# Patient Record
Sex: Male | Born: 1940 | Race: White | Hispanic: No | Marital: Single | Smoking: Former smoker
Health system: Southern US, Community
[De-identification: ages and names within clinical notes are randomized; demographics above are authoritative.]

## PROBLEM LIST (undated history)

## (undated) DIAGNOSIS — R0600 Dyspnea, unspecified: Secondary | ICD-10-CM

## (undated) DIAGNOSIS — I340 Nonrheumatic mitral (valve) insufficiency: Secondary | ICD-10-CM

## (undated) DIAGNOSIS — I2510 Atherosclerotic heart disease of native coronary artery without angina pectoris: Principal | ICD-10-CM

## (undated) DIAGNOSIS — J449 Chronic obstructive pulmonary disease, unspecified: Secondary | ICD-10-CM

## (undated) DIAGNOSIS — E119 Type 2 diabetes mellitus without complications: Secondary | ICD-10-CM

## (undated) DIAGNOSIS — E785 Hyperlipidemia, unspecified: Secondary | ICD-10-CM

## (undated) DIAGNOSIS — K922 Gastrointestinal hemorrhage, unspecified: Secondary | ICD-10-CM

## (undated) DIAGNOSIS — I482 Chronic atrial fibrillation, unspecified: Secondary | ICD-10-CM

## (undated) DIAGNOSIS — D649 Anemia, unspecified: Secondary | ICD-10-CM

## (undated) DIAGNOSIS — Z9861 Coronary angioplasty status: Principal | ICD-10-CM

## (undated) DIAGNOSIS — S2239XA Fracture of one rib, unspecified side, initial encounter for closed fracture: Secondary | ICD-10-CM

## (undated) DIAGNOSIS — R0609 Other forms of dyspnea: Secondary | ICD-10-CM

## (undated) DIAGNOSIS — Z951 Presence of aortocoronary bypass graft: Secondary | ICD-10-CM

## (undated) DIAGNOSIS — I4891 Unspecified atrial fibrillation: Secondary | ICD-10-CM

## (undated) DIAGNOSIS — R791 Abnormal coagulation profile: Secondary | ICD-10-CM

## (undated) DIAGNOSIS — I10 Essential (primary) hypertension: Secondary | ICD-10-CM

## (undated) HISTORY — DX: Atherosclerotic heart disease of native coronary artery without angina pectoris: I25.10

## (undated) HISTORY — DX: Other forms of dyspnea: R06.09

## (undated) HISTORY — DX: Hyperlipidemia, unspecified: E78.5

## (undated) HISTORY — DX: Presence of aortocoronary bypass graft: Z95.1

## (undated) HISTORY — DX: Essential (primary) hypertension: I10

## (undated) HISTORY — DX: Dyspnea, unspecified: R06.00

## (undated) HISTORY — DX: Chronic obstructive pulmonary disease, unspecified: J44.9

## (undated) HISTORY — DX: Coronary angioplasty status: Z98.61

## (undated) HISTORY — DX: Type 2 diabetes mellitus without complications: E11.9

## (undated) HISTORY — DX: Nonrheumatic mitral (valve) insufficiency: I34.0

## (undated) HISTORY — DX: Chronic atrial fibrillation, unspecified: I48.20

---

## 2005-09-23 DIAGNOSIS — I2510 Atherosclerotic heart disease of native coronary artery without angina pectoris: Secondary | ICD-10-CM

## 2005-09-23 HISTORY — DX: Atherosclerotic heart disease of native coronary artery without angina pectoris: I25.10

## 2005-10-02 ENCOUNTER — Ambulatory Visit (HOSPITAL_COMMUNITY): Admission: RE | Admit: 2005-10-02 | Discharge: 2005-10-03 | Payer: Self-pay | Admitting: *Deleted

## 2005-12-24 HISTORY — PX: CARDIAC CATHETERIZATION: SHX172

## 2006-10-24 DIAGNOSIS — I2510 Atherosclerotic heart disease of native coronary artery without angina pectoris: Secondary | ICD-10-CM

## 2006-10-24 HISTORY — DX: Atherosclerotic heart disease of native coronary artery without angina pectoris: I25.10

## 2006-11-13 ENCOUNTER — Encounter: Admission: RE | Admit: 2006-11-13 | Discharge: 2006-11-13 | Payer: Self-pay | Admitting: *Deleted

## 2006-11-19 ENCOUNTER — Ambulatory Visit (HOSPITAL_COMMUNITY): Admission: RE | Admit: 2006-11-19 | Discharge: 2006-11-19 | Payer: Self-pay | Admitting: *Deleted

## 2006-12-24 HISTORY — PX: CORONARY ARTERY BYPASS GRAFT: SHX141

## 2007-01-24 DIAGNOSIS — Z951 Presence of aortocoronary bypass graft: Secondary | ICD-10-CM

## 2007-01-24 HISTORY — DX: Presence of aortocoronary bypass graft: Z95.1

## 2007-02-05 ENCOUNTER — Inpatient Hospital Stay (HOSPITAL_COMMUNITY): Admission: RE | Admit: 2007-02-05 | Discharge: 2007-02-10 | Payer: Self-pay | Admitting: Surgery

## 2007-02-05 ENCOUNTER — Ambulatory Visit: Payer: Self-pay | Admitting: Surgery

## 2007-02-14 ENCOUNTER — Ambulatory Visit: Payer: Self-pay | Admitting: Cardiothoracic Surgery

## 2007-02-21 ENCOUNTER — Ambulatory Visit: Payer: Self-pay | Admitting: Cardiothoracic Surgery

## 2007-02-28 ENCOUNTER — Ambulatory Visit: Payer: Self-pay | Admitting: Cardiothoracic Surgery

## 2007-02-28 ENCOUNTER — Encounter: Admission: RE | Admit: 2007-02-28 | Discharge: 2007-02-28 | Payer: Self-pay | Admitting: Surgery

## 2007-03-06 ENCOUNTER — Encounter (HOSPITAL_COMMUNITY): Admission: RE | Admit: 2007-03-06 | Discharge: 2007-04-04 | Payer: Self-pay | Admitting: *Deleted

## 2007-06-06 ENCOUNTER — Ambulatory Visit: Payer: Self-pay | Admitting: Cardiothoracic Surgery

## 2007-06-12 ENCOUNTER — Encounter: Admission: RE | Admit: 2007-06-12 | Discharge: 2007-06-12 | Payer: Self-pay | Admitting: *Deleted

## 2007-06-16 ENCOUNTER — Ambulatory Visit (HOSPITAL_COMMUNITY): Admission: RE | Admit: 2007-06-16 | Discharge: 2007-06-16 | Payer: Self-pay | Admitting: *Deleted

## 2007-09-05 ENCOUNTER — Ambulatory Visit (HOSPITAL_COMMUNITY): Admission: RE | Admit: 2007-09-05 | Discharge: 2007-09-05 | Payer: Self-pay | Admitting: Cardiovascular Disease

## 2008-10-01 DIAGNOSIS — I10 Essential (primary) hypertension: Secondary | ICD-10-CM

## 2008-10-01 DIAGNOSIS — E785 Hyperlipidemia, unspecified: Secondary | ICD-10-CM

## 2008-10-04 ENCOUNTER — Ambulatory Visit: Payer: Self-pay | Admitting: Internal Medicine

## 2008-10-04 DIAGNOSIS — R0609 Other forms of dyspnea: Secondary | ICD-10-CM

## 2008-10-25 ENCOUNTER — Ambulatory Visit: Payer: Self-pay | Admitting: Internal Medicine

## 2008-10-26 ENCOUNTER — Encounter: Payer: Self-pay | Admitting: Internal Medicine

## 2008-10-26 ENCOUNTER — Telehealth: Payer: Self-pay | Admitting: Internal Medicine

## 2008-10-26 ENCOUNTER — Ambulatory Visit: Payer: Self-pay | Admitting: Internal Medicine

## 2008-10-26 ENCOUNTER — Encounter (INDEPENDENT_AMBULATORY_CARE_PROVIDER_SITE_OTHER): Payer: Self-pay | Admitting: *Deleted

## 2008-10-29 ENCOUNTER — Telehealth (INDEPENDENT_AMBULATORY_CARE_PROVIDER_SITE_OTHER): Payer: Self-pay | Admitting: *Deleted

## 2009-05-25 ENCOUNTER — Encounter: Admission: RE | Admit: 2009-05-25 | Discharge: 2009-05-25 | Payer: Self-pay | Admitting: Emergency Medicine

## 2011-01-14 ENCOUNTER — Encounter: Payer: Self-pay | Admitting: Surgery

## 2011-05-11 NOTE — Op Note (Signed)
NAMECAPTAIN, BLUCHER                ACCOUNT NO.:  [id number]   MEDICAL RECORD NO.:  [id number]          PATIENT TYPE:  INP   LOCATION:  2305                         FACILITY:  [facility]   PHYSICIAN:  Evelene Croon, M.D.     DATE OF BIRTH:  1941/02/20   DATE OF PROCEDURE:  02/05/2007  DATE OF DISCHARGE:                               OPERATIVE REPORT   PREOPERATIVE DIAGNOSIS:  Severe three-vessel coronary disease.   POSTOPERATIVE DIAGNOSIS:  Severe three-vessel coronary disease.   OPERATIVE PROCEDURE:  Median sternotomy, extracorporeal circulation,  coronary bypass graft surgery x4 using a left internal mammary artery  graft to the left anterior descending coronary artery, with a saphenous  vein graft to the diagonal branch of the left anterior descending  artery, a saphenous vein graft to the intermediate coronary artery, and  a saphenous vein graft to the posterolateral branch of the left  circumflex coronary artery.  Endoscopic vein harvesting from both legs.   ATTENDING SURGEON:  Evelene Croon, M.D.   ASSISTANT:  Salvatore Decent. Cornelius Moras, M.D.   SECOND ASSISTANT:  Constance Holster, [place]   ANESTHESIA:  General endotracheal.   CLINICAL HISTORY:  This patient is a 70 year old gentleman with a  history of hypertension, hyperlipidemia, and ongoing heavy tobacco  abuse, who presented with an abnormal EKG and chest pain and underwent  cardiac catheterization on October 02, 2006, showing an 80% mid-LAD  stenosis involving a diagonal bifurcation as well as a 99% mid AV groove  left circumflex lesion.  He underwent stenting of the left circumflex  lesion at that time with a Cypher stent.  His chest pain resolved and he  had been doing well.  He subsequently was scheduled for a staged PCI of  his LAD lesion and underwent catheterization on November 19, 2006, which  showed significant three-vessel disease.  A decision was made not to  perform stenting of the LAD at that time and he was referred for  surgery.  The LAD was a large vessel that coursed around the apex.  There was about 40% ostial and proximal stenosis with calcification and  the mid LAD was tortuous at the takeoff of the first diagonal with about  60% stenosis.  There is a long 80% stenosis beyond the diagonal takeoff.  The diagonal itself had about 60% proximal stenosis.  The left  circumflex gave off a medium-sized intermediate branch that had proximal  stenosis.  The left circumflex was a large vessel that gave off a  posterior descending and posterolateral branch.  There was 70% stenosis  prior to the origin of the AV groove stent, which was widely patent.  There is also about 80% ostial stenosis in the posterior descending  branch.  The right coronary artery was a small, nondominant vessel which  had sequential 60-80% proximal stenosis.  After review of the angiogram  and examination of the patient, it was felt that coronary bypass graft  surgery was the best treatment for this patient.  I discussed the  operative procedure with the patient including alternatives, benefits,  and risks including but not limited  to bleeding, blood transfusion,  infection, stroke, myocardial infarction, graft failure, and death.  I  also discussed the importance of maximum cardiac risk factor reduction.  He understood and agreed to proceed.   OPERATIVE PROCEDURE:  The patient was taken to the operating room and  placed on the table in supine position.  After induction of general  endotracheal anesthesia, a Foley catheter was placed in the bladder  using sterile technique.  Then the chest, abdomen and both lower  extremities were prepped and draped in the usual sterile manner.  The  chest was opened through a median sternotomy incision and the  pericardium opened in the midline.  Examination of the heart showed good  ventricular contractility.  The ascending aorta had no palpable plaques  in it.   Then the left internal mammary artery  was harvested from the chest wall  as a pedicle graft.  This is a medium caliber vessel with excellent  blood flow through it.  At the same time a segment of greater saphenous  vein was harvested from the right leg using endoscopic vein harvest  technique.  This vein was of small to medium caliber but good quality.  Below the knee this vein was too small and therefore another section of  vein was harvested from the left thigh using endoscopic vein harvest  technique, and this vein was also of small to medium caliber and good  quality.   Then the patient was heparinized and when an adequate activated clotting  time was achieved, the distal ascending aorta was cannulated using a 20-  [place] aortic cannula for arterial inflow.  Venous outflow was achieved  using a two-stage venous cannula through the right atrial appendage.  An  antegrade cardioplegia and vent cannula was inserted in the aortic root.   The patient was placed on cardiopulmonary bypass and the distal coronary  arteries identified.  The LAD was a large, graftable vessel with no  distal disease in it.  The diagonal branch was a large vessel with no  distal disease in it.  The intermediate was a large vessel that became  intramyocardial distally.  It was located just as it was entering the  muscle, where was a large, graftable vessel.  The left circumflex had a  large posterolateral branch, which was suitable for grafting.  The  distal left circumflex terminated as a small posterior descending branch  that was lying high near the AV groove and was very small where it was  visible and not suitable for grafting.  The right coronary was a small,  nondominant vessel that was lying deep beneath the epicardial fat and  was not suitable for grafting.   Then the aorta was crossclamped and 1000 mL of cold blood antegrade  cardioplegia was administered in the aortic root with quick arrest of the heart.  Systemic hypothermia to 28  degrees centigrade and topical  hypothermic with iced saline was used.  A temperature probe was placed  in the septum and an insulating pad in the pericardium.   The first distal anastomosis was performed to the posterolateral branch  of the left circumflex coronary.  The internal diameter was about 2 mm.  Conduit used was a segment of greater saphenous vein and the anastomosis  performed in an end-to-side manner using continuous 7-0 Prolene suture.  Flow was noted through the graft and was excellent.   The second distal anastomosis was performed to the intermediate coronary  artery.  The internal  diameter of this vessel was about 1.75 mm.  The  conduit used was a second segment of greater saphenous vein and the  anastomosis performed in an end-to-side manner using continuous 7-0  Prolene suture.  Flow was noted through the graft and was excellent.   A third distal anastomosis was performed to the diagonal branch.  The  internal diameter was about 1.75 mm.  The conduit used was a third  segment of greater saphenous vein and the anastomosis performed in an  end-to-side manner using continuous 7-0 Prolene suture.  Flow was noted  through the graft and was excellent.  Then another dose of cardioplegia  given down the vein graft and in the aortic root.   The fourth distal anastomosis was performed to the distal LAD.  The  internal diameter of this vessel was about 2 mm.  The conduit used was  the  left internal mammary graft and this was brought through an opening  in the left pericardium anterior to the phrenic nerve.  It was  anastomosed the LAD in an end-to-side manner continuous 8-0 Prolene  suture.  The pedicle was sutured to the epicardium with 6-0 Prolene  sutures.  The patient was rewarmed to 37 degrees centigrade.  Then  another dose of cardioplegia was given in the aortic root.  The proximal  vein graft anastomoses were then performed to the aortic root in an end-  to-side  manner continuous 6-0 Prolene suture.  Then the clamp was  removed from the mammary pedicle.  There was rapid warming of the  ventricular septum and return of spontaneous ventricular fibrillation.  The crossclamp was then removed with a time of 74 minutes and the  patient spontaneously converted to sinus rhythm.   The proximal and distal anastomoses appeared hemostatic and the  alignment of the grafts satisfactory.  Graft markers were placed around  the proximal anastomoses.  Two temporary right ventricular and right  atrial pacing wires were placed and brought out through the skin.   When the patient had rewarmed to 37 degrees centigrade, he was weaned  from cardiopulmonary bypass on no inotropic agents.  Total bypass time  was 91 minutes.  Cardiac function appeared excellent with a cardiac  output of 5 L/min.  Protamine was given and venous and aortic cannulas  were removed without difficulty.  Hemostasis was achieved without difficulty.  Three chest tubes were placed with a tube in the posterior  pericardium, one in the left pleural space, and one in the anterior  mediastinum.  The pericardium was reapproximated over the heart.  The  sternum was closed with #6 stainless steel wires.  The fascia was closed  with continuous #1 Vicryl suture.  The subcutaneous tissue was closed  with continuous 2-0 Vicryl and the skin with a 3-0 Vicryl subcuticular  closure.  The lower extremity vein harvest site was closed in layers in  a similar manner.  The sponge, needle and instrument counts were correct  according to the scrub nurse.  Dry sterile dressings were applied over  the incisions and around the chest tubes, which were hooked to Pleur-  Evac suction.  The patient remained hemodynamically stable and was  transported to the SICU in guarded but stable condition.      Evelene Croon, M.D.  Electronically Signed     BB/MEDQ  D:  02/05/2007  T:  02/05/2007  Job:  [id number]   cc:   Darlin Priestly, MD  [facility]

## 2011-05-11 NOTE — Discharge Summary (Signed)
Eric Calderon, Eric Calderon                ACCOUNT NO.:  [id number]   MEDICAL RECORD NO.:  [id number]          PATIENT TYPE:  INP   LOCATION:  2030                         FACILITY:  [facility]   PHYSICIAN:  Evelene Croon, M.D.     DATE OF BIRTH:  03-15-1941   DATE OF ADMISSION:  02/05/2007  DATE OF DISCHARGE:  02/11/2004                               DISCHARGE SUMMARY   PRIMARY DIAGNOSIS:  Severe three-vessel coronary artery disease.   IN-HOSPITAL DIAGNOSES:  1. Postoperative atrial fibrillation.  2. Acute blood loss anemia.  3. Diabetes mellitus type 2.  4. Volume overload postoperatively.   SECONDARY DIAGNOSES:  1. Hypertension.  2. Hyperlipidemia.  3. Tobacco abuse.  4. Coronary artery disease, status post stenting of the left      circumflex lesion with a Cypher stent.  5. History of gout.   OPERATIONS AND PROCEDURES:  1. Coronary bypass grafting x4 using a left internal mammary artery      graft to the left anterior descending coronary artery, saphenous      vein graft to diagonal branch of the left anterior descending      artery, saphenous vein graft to intermediate coronary artery,      saphenous vein graft to posterolateral branch of left circumflex      coronary artery.  Endoscopic vein harvesting from bilateral legs.   PATIENT'S HISTORY AND PHYSICAL AND HOSPITAL COURSE:  The patient is a 59-  year gentleman with history of hypertension, hyperlipidemia, ongoing  tobacco abuse who presented with abnormal EKG and chest pain.  Cardiac  catheterization was done October 02, 2006 showing an 80% mid LAD  stenosis involving the diagonal bifurcation as well as 99% mid AV groove  left circumflex lesion.  He underwent stenting of the left circumflex  lesion at that time with a Cypher stent.  Chest pain resolved and he had  done well.  He subsequently was scheduled for a CT and PCI of his LAD  lesion and underwent cardiac catheterization November 19, 2006.  This  showed significant  three-vessel coronary artery disease.  A decision was  made to performing stenting of the LAD.  The patient was referred for  surgery.  The patient was seen and evaluated by Dr. Laneta Simmers.  Dr. Laneta Simmers  discussed with the patient undergoing coronary bypass grafting.  He  discussed risks and benefits.  The patient acknowledged understanding  and agreed to proceed.  Surgery was scheduled for February 05, 2006.  For the patient's past medical history and physical exam, please see  dictated history and physical.   The patient was taken to the operating room February 05, 2007 where he  underwent coronary artery bypass grafting x4 using a left internal  mammary artery graft to left anterior descending coronary artery,  saphenous vein graft to diagonal branch of left anterior descending  artery, saphenous vein graft to intermediate coronary artery and a  saphenous vein graft to posterolateral branch of left circumflex  coronary artery.  Endoscopic vein harvesting was bilateral legs was  done.  The patient tolerated this procedure well and was transferred  to  the intensive care unit in stable condition.  Postoperatively, the  patient was noted to be hemodynamically stable.  He was extubated the  evening of surgery.  Following extubation, he was noted to be alert and  oriented x4.  Neuro intact.  Postoperative day #1, the patient's vital  signs were stable.  He was able to be weaned off oxygen and the systolic  blood pressure remained greater than 100.  He had minimal drainage from  chest tubes and chest tubes and monitors were discontinued.  Postoperative day #2, the patient did develop a postoperative atrial  fibrillation.  IV amiodarone was initiated.  The patient was able to  convert back into normal sinus rhythm.  He was transferred up to 2000  postoperative day #2.  After initial conversion back to normal sinus  rhythm, the patient remained in normal sinus rhythm.  He was changed to  p.o.  amiodarone and continued on this.  The patient's pulmonary status  was stable.  He was able to be weaned off oxygen sating greater than 90%  on room air.  His incisions were clean, dry and intact and healing well.  He did develop acute blood loss anemia with hemoglobin and hematocrit  9.2 and 26.7 on postoperative day #4.  The patient was started on p.o.  iron.  He was asymptomatic.  This was monitored closely.  The patient  also developed volume overload.  Diuretics were initiated.  He was back  near his baseline prior to discharge home.  Preoperatively, the patient  had undergone a hemoglobin A1c.  This was noted to be elevated at 7.4.  Daily blood sugars were monitored.  The patient was encouraged a  carbohydrate modified medium calorie diet.  Attempting to hold off on  p.o. medications.  The patient was out of bed ambulating well  postoperatively.  He was tolerating diet well with no nausea or  vomiting.  He was noted to be afebrile postoperatively.   The patient is tentatively ready for discharge home in a.m.  postoperative day #5, February 10, 2007.   FOLLOW-UP APPOINTMENT:  A follow-up appointment will be arranged with  Dr. Laneta Simmers for in three weeks.  Our office will contact the patient with  this information.  The patient will need to obtain a PMI chest x-ray one  hour prior to this appointment.  The patient also needs to contact Dr.  Mikey Bussing office to schedule a follow-up appointment with him in 2  weeks.  He will also need to contact his primary care doctor and  schedule a follow-up appointment with him in 2 weeks for diabetes  mellitus management.   ACTIVITY:  The patient is instructed no driving until released to do so,  no lifting over 10 pounds.  He is told to ambulate 3-4 times per day,  progress as tolerated.  Continue his breathing exercises.   INCISIONAL CARE:  The patient was told to shower washing his incisions using soap and water.  He is to contact us if he  develops any drainage  or opening from any of his incision sites.   DIET:  The patient was educated on diet to be low-fat, low-salt,  carbohydrate modified medium calorie diet.   DISCHARGE MEDICATIONS:  1. Aspirin 325 mg daily.  2. Toprol XL 25 mg daily.  3. Lisinopril 10 mg daily.  4. Vytorin 10/20 mg at night.  5. Plavix 75 mg daily.  6. Lasix 40 mg daily x5 days.  7. Potassium  chloride 20 mEq daily x5 days.  8. Niferex 150 mg daily.  9. Amiodarone 400 mg b.i.d. x14 days and 200 mg b.i.d..  10.Oxycodone 5 mg 1-2 tablets q.4-6h. p.r.n. pain.      Theda Belfast, [place]      Evelene Croon, M.D.  Electronically Signed    KMD/MEDQ  D:  02/09/2007  T:  02/09/2007  Job:  [id number]   cc:   Evelene Croon, M.D.  Darlin Priestly, MD

## 2011-05-11 NOTE — Cardiovascular Report (Signed)
NAMEAERON, [name]:  [id number]   MEDICAL RECORD NO.:  [id number]          PATIENT TYPE:  OIB   LOCATION:  2899                         FACILITY:  [facility]   PHYSICIAN:  Eric Priestly, MD  DATE OF BIRTH:  07/29/1941   DATE OF PROCEDURE:  11/19/2006  DATE OF DISCHARGE:                            CARDIAC CATHETERIZATION   PROCEDURES:  1. Left heart catheterization.  2. Coronary angiography.  3. Left ventriculogram.   ATTENDING:  Darlin Priestly, MD.   COMPLICATIONS:  None.   INDICATIONS:  Mr. Eric Calderon is a 70 year old male, patient of Dr. Melven Calderon at  [facility] with a history of hypertension, ongoing  tobacco use, hyperlipidemia, initially referred for abnormal EKG and  chest pain with cardiac catheterization on October 02, 2005 revealing  80% mid LAD involving a diagonal at bifurcation as well as a 99% mid AV  groove circ lesion.  We did place a 3 x 13 Cypher in his AV groove and  he has done well since that time.  His LAD was planned to be fixed in a  staged fashion; however, Mr. Eric Calderon opted to wait until insurance  coverage prior to proceeding with angioplasty.  He is now brought for  possible PCI of the LAD.   DESCRIPTION OF OPERATION:  After giving informed, written consent, the  patient was brought to the cardiac catheterization lab.  Groin was  shaved, prepped and draped in the usual fashion.  ECG monitor was  obtained.  Using modified Seldinger technique, a # 6-French arterial  sheath in the right femoral artery.  A 6-French JL4 Guidant catheter was  then used to perform left coronary angiogram.   The left main is a large vessel with no significant disease.   The LAD is a medium to large vessel which coursed __________ to one  large diagonal branch.  The LAD is noted to be calcified in its proximal  portion up to 40% in the ostial and proximal segment with a tortuous  segment and its origin.  The mid LAD is also noted to be  tortuous at the  takeoff of the first diagonal with a 60% lesion.  Prior to the diagonal  takeoff, an 80% 15-20 mm segment beyond the diagonal takeoff.  The  remainder of the LAD does not appear to have any significant disease.   The first diagonal is a medium sized vessel with 60% proximal lesion.   The left coronary artery also gives rise to a medium sized ramus  intermedius which bifurcates in the mid segment with no significant  disease.   The left circumflex is a large vessel which courses the AV groove and  goes to PDA and posterolateral branch and is noted to be dominant.  The  AV groove stent is widely patent.  There is a 70% lesion prior to the  origin of the AV groove stent.   PDA and posterolateral branch originate from the circumflex.  There is  an 80% ostial PDA lesion and medium sized vessel.   The right coronary artery is a small to  medium sized nondominant vessel  which bifurcates in the mid segment.  There is sequential 80, 70, 60%  lesions throughout its proximal mid segment.   Left ventriculogram reveals a preserved EF of 50%.   HEMODYNAMICS:  Systemic arterial pressure 162/64, LV systolic pressure  163/13, LVEDP is 24.   CONCLUSION:  1. Significant three vessel coronary artery disease.  2. Normal left ventricular systolic function.      Eric Priestly, MD  Electronically Signed     RHM/MEDQ  D:  11/19/2006  T:  11/19/2006  Job:  [id number]   cc:   Dr. Melven Calderon

## 2013-02-19 ENCOUNTER — Other Ambulatory Visit (HOSPITAL_COMMUNITY): Payer: Self-pay | Admitting: Cardiology

## 2013-02-19 DIAGNOSIS — I340 Nonrheumatic mitral (valve) insufficiency: Secondary | ICD-10-CM

## 2013-03-11 ENCOUNTER — Ambulatory Visit: Payer: Self-pay | Admitting: Cardiology

## 2013-03-11 DIAGNOSIS — I482 Chronic atrial fibrillation, unspecified: Secondary | ICD-10-CM | POA: Insufficient documentation

## 2013-03-11 DIAGNOSIS — Z7901 Long term (current) use of anticoagulants: Secondary | ICD-10-CM | POA: Insufficient documentation

## 2013-03-11 DIAGNOSIS — I4891 Unspecified atrial fibrillation: Secondary | ICD-10-CM

## 2013-03-24 DIAGNOSIS — I340 Nonrheumatic mitral (valve) insufficiency: Secondary | ICD-10-CM

## 2013-03-24 HISTORY — DX: Nonrheumatic mitral (valve) insufficiency: I34.0

## 2013-04-02 ENCOUNTER — Ambulatory Visit (HOSPITAL_COMMUNITY)
Admission: RE | Admit: 2013-04-02 | Discharge: 2013-04-02 | Disposition: A | Discharge status: Home / Self Care | Payer: Medicare Other | Source: Ambulatory Visit | Attending: Cardiology | Admitting: Cardiology

## 2013-04-02 DIAGNOSIS — I2510 Atherosclerotic heart disease of native coronary artery without angina pectoris: Secondary | ICD-10-CM | POA: Insufficient documentation

## 2013-04-02 DIAGNOSIS — I10 Essential (primary) hypertension: Secondary | ICD-10-CM | POA: Insufficient documentation

## 2013-04-02 DIAGNOSIS — J449 Chronic obstructive pulmonary disease, unspecified: Secondary | ICD-10-CM | POA: Insufficient documentation

## 2013-04-02 DIAGNOSIS — R0989 Other specified symptoms and signs involving the circulatory and respiratory systems: Secondary | ICD-10-CM | POA: Insufficient documentation

## 2013-04-02 DIAGNOSIS — R0609 Other forms of dyspnea: Secondary | ICD-10-CM | POA: Insufficient documentation

## 2013-04-02 DIAGNOSIS — I059 Rheumatic mitral valve disease, unspecified: Secondary | ICD-10-CM | POA: Insufficient documentation

## 2013-04-02 DIAGNOSIS — I4891 Unspecified atrial fibrillation: Secondary | ICD-10-CM | POA: Insufficient documentation

## 2013-04-02 DIAGNOSIS — E119 Type 2 diabetes mellitus without complications: Secondary | ICD-10-CM | POA: Insufficient documentation

## 2013-04-02 DIAGNOSIS — J4489 Other specified chronic obstructive pulmonary disease: Secondary | ICD-10-CM | POA: Insufficient documentation

## 2013-04-02 DIAGNOSIS — I340 Nonrheumatic mitral (valve) insufficiency: Secondary | ICD-10-CM

## 2013-04-02 HISTORY — PX: TRANSTHORACIC ECHOCARDIOGRAM: SHX275

## 2013-04-02 NOTE — Progress Notes (Signed)
2D Echo Performed 04/02/2013    Rolene Andrades, RCS  

## 2013-05-14 ENCOUNTER — Other Ambulatory Visit: Payer: Self-pay | Admitting: *Deleted

## 2013-05-14 MED ORDER — SIMVASTATIN 40 MG PO TABS: 40.0000 mg | ORAL_TABLET | Freq: Every day | ORAL | Status: DC

## 2013-05-14 NOTE — Telephone Encounter (Signed)
rx refill

## 2013-05-15 ENCOUNTER — Other Ambulatory Visit: Payer: Self-pay | Admitting: *Deleted

## 2013-05-15 MED ORDER — SIMVASTATIN 40 MG PO TABS: 40.0000 mg | ORAL_TABLET | Freq: Every day | ORAL | Status: DC

## 2013-05-15 NOTE — Telephone Encounter (Signed)
REFILLED SIMVASTATIN

## 2013-05-21 ENCOUNTER — Ambulatory Visit (INDEPENDENT_AMBULATORY_CARE_PROVIDER_SITE_OTHER): Payer: 59 | Admitting: Pharmacist Clinician (PhC)/ Clinical Pharmacy Specialist

## 2013-05-21 VITALS — BP 120/86 | HR 76

## 2013-05-21 DIAGNOSIS — Z7901 Long term (current) use of anticoagulants: Secondary | ICD-10-CM

## 2013-05-21 DIAGNOSIS — I4891 Unspecified atrial fibrillation: Secondary | ICD-10-CM

## 2013-05-21 LAB — POCT INR: INR: 1.8

## 2013-06-09 ENCOUNTER — Telehealth: Payer: Self-pay | Admitting: Pharmacist Clinician (PhC)/ Clinical Pharmacy Specialist

## 2013-06-09 DIAGNOSIS — I4891 Unspecified atrial fibrillation: Secondary | ICD-10-CM

## 2013-06-09 MED ORDER — RIVAROXABAN 15 MG PO TABS: 15.0000 mg | ORAL_TABLET | Freq: Every day | ORAL | Status: DC

## 2013-06-09 NOTE — Telephone Encounter (Signed)
Pt is to switch from warfarin to Xarelto 15mg  (last ClCr approx 48 - drawn 03/2012).  Rx sent to pharmacy, will mail lab order for patient to get CMP in 2 weeks.

## 2013-07-06 ENCOUNTER — Other Ambulatory Visit: Payer: Self-pay | Admitting: *Deleted

## 2013-07-08 ENCOUNTER — Telehealth: Payer: Self-pay | Admitting: *Deleted

## 2013-07-08 NOTE — Telephone Encounter (Signed)
Spoke to pharmacist.Informed that Xarelto has been approved authorization. Left message on patients answer machine

## 2013-07-27 ENCOUNTER — Encounter: Payer: Self-pay | Admitting: Cardiology

## 2013-07-28 ENCOUNTER — Encounter: Payer: Self-pay | Admitting: Cardiology

## 2013-07-28 ENCOUNTER — Ambulatory Visit (INDEPENDENT_AMBULATORY_CARE_PROVIDER_SITE_OTHER): Payer: 59 | Admitting: Cardiology

## 2013-07-28 VITALS — BP 114/62 | HR 80 | Ht 69.0 in | Wt 166.4 lb

## 2013-07-28 DIAGNOSIS — E785 Hyperlipidemia, unspecified: Secondary | ICD-10-CM

## 2013-07-28 DIAGNOSIS — Z9861 Coronary angioplasty status: Secondary | ICD-10-CM

## 2013-07-28 DIAGNOSIS — I4891 Unspecified atrial fibrillation: Secondary | ICD-10-CM

## 2013-07-28 DIAGNOSIS — R0602 Shortness of breath: Secondary | ICD-10-CM

## 2013-07-28 DIAGNOSIS — I2510 Atherosclerotic heart disease of native coronary artery without angina pectoris: Secondary | ICD-10-CM

## 2013-07-28 DIAGNOSIS — Z7901 Long term (current) use of anticoagulants: Secondary | ICD-10-CM

## 2013-07-28 DIAGNOSIS — I482 Chronic atrial fibrillation, unspecified: Secondary | ICD-10-CM

## 2013-07-28 DIAGNOSIS — I10 Essential (primary) hypertension: Secondary | ICD-10-CM

## 2013-07-28 NOTE — Patient Instructions (Addendum)
Take 1/2 tablet of simvastatin of 40 mg will decrease an interaction with Amlodipine    Your physician wants you to follow-up in 6 month Dr Herbie [place].   You will receive a reminder letter in the mail two months in advance. If you don't receive a letter, please call our office to schedule the follow-up appointment.

## 2013-07-31 ENCOUNTER — Encounter: Payer: Self-pay | Admitting: Cardiology

## 2013-07-31 DIAGNOSIS — I25119 Atherosclerotic heart disease of native coronary artery with unspecified angina pectoris: Secondary | ICD-10-CM | POA: Insufficient documentation

## 2013-07-31 NOTE — Assessment & Plan Note (Signed)
On warfarin but no signs of bleeding or other issues.

## 2013-07-31 NOTE — Assessment & Plan Note (Signed)
He is doing really well no active symptoms. No chest pain or shortness of breath with rest or exertion just his baseline dyspnea. On beta blocker, dihydropyridine calcium channel blocker/ACE inhibitor combination, statin. Not on aspirin do to warfarin and frequent indomethacin use.  Plan: Continue current measurement.

## 2013-07-31 NOTE — Assessment & Plan Note (Addendum)
On simvastatin, however with the amlodipine we need to cut the dose from 40mg  down to 20 mg daily in addition to the fenofibrate.

## 2013-07-31 NOTE — Assessment & Plan Note (Addendum)
This is probably multifactorial and in no small part due to COPD based on his CPET results. His MR along, is not significant enough to cause them any problems. One other thought is that he just isn't doing very well with age fibrillation, maybe this chronotropic incompetence as well. Do not want to titrate beta blocker dose up. Would preferentially use calcium channel blocker for rate control as needed.

## 2013-07-31 NOTE — Assessment & Plan Note (Signed)
Blood pressure well controlled at present on low-dose beta blocker, amlodipine but as per combination and standing Lasix.

## 2013-07-31 NOTE — Progress Notes (Signed)
Patient ID: Eric Calderon, male   DOB: 08/26/1941, 72 y.o.   MRN: [id number] PCP: Egbert Garibaldi, NP  Clinic Note: Chief Complaint  Patient presents with  . Follow-up    6 month rov, SOB    HPI: Eric Calderon is a 72 y.o. male with a PMH below who presents today for followup of his stress coronary disease with CABG, chronic atrial fibrillation and dyspnea. He also has moderate mitral regurgitation by echo. He is a long detailed cardiac history starting 2006 as described below. Basically had multivessel disease word the initial plan was to stage PCI but when he came back for his second lesion was about a year later and he had significant progression from a year earlier and had 70% ISR of the circumflex that. He was therefore referred CABG. He is now had this persistent dyspnea evaluated over last year or so with an echocardiogram and a Myoview that not only been on helpful. We also did a CPET test that basically showed poor overall effort ability..  Interval History: Today relating notes occasional palpitations when his baseline dyspnea jugular his pains in his hips knees and back. He says he is a little lightheaded he gets up too quickly. Despite these minor symptoms. He denies any chest pain or short of breath   No chest pain with exertion, or is it takes him some significant addition to get dyspneic no syncope or near 68, no TIA or amaurosis fugax of the. No melena, hematochezia or hematuria. And no true claudication symptoms.  Past Medical History  Diagnosis Date  . CAD S/P percutaneous coronary angioplasty October 2006    CATH: 80% MID LAD, involving diagonal bifurcation; 99%  AV GROOVE  CIRCUMFLEX --> PCI of circumflex with 3.0 mm 13 and a Cypher DES; planned staged LAD PCI  . CAD, multiple vessel November 2007    LAD lesion persists, 80% pre-D1 with 60% D1 and distal down the one lesion. 70% ISR and circumflex, L. PDA 80%; nondominant RCA - diffuse 80-70%  . 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 Exertional dyspnea August 2011    Myoview: No ischemia or infarct; echo: Normal LV size function, EF 55-60%, moderate to severe LA and RA dilation, mildly elevated RVP, mild aortic sclerosis  . Dyspnea 04/02/2013     ECHO:  LV EF  55-60%, aortic sclerosis, moderate MR, moderate to severe LA and RA dilation, increased RV pressure -mild PHtn                 . Dyslipidemia   . Diabetes mellitus without complication     2012  . Shortness of breath August 2001    MYOVIEW:NO ISCHEMIA OR INFARCT   . Chronic atrial fibrillation     Rate control with beta blocker, anticoagulated warfarin  . Hypertension   . COPD (chronic obstructive pulmonary disease)     Greater than 100 pack year smoking history, quit 2009  . Moderate mitral regurgitation April 2014    Echo    Prior Cardiac Evaluation and Past Surgical History: Past Surgical History  Procedure Laterality Date  . Cardiac catheterization  2007    CONCLUSION SIGNIFICANT THREE VESSEL CAD  2.NORMAL LEFT VENTRICULAR SYSTOLIC FUNCTION  . Coronary artery bypass graft  2008    x4    Allergies  Allergen Reactions  . Niaspan (Niacin Er)     Current Outpatient Prescriptions  Medication Sig Dispense Refill  .  allopurinol (ZYLOPRIM) 100 MG tablet Take 1 tablet by mouth daily.      [name] amLODipine-benazepril (LOTREL) 5-40 MG per capsule Take 1 capsule by mouth daily.      . colchicine 0.6 MG tablet Take 0.6 mg by mouth 2 (two) times daily as needed.      . fenofibrate 160 MG tablet Take 1 tablet by mouth daily.      . furosemide (LASIX) 40 MG tablet Take 1 tablet by mouth daily.      [name] glipiZIDE (GLUCOTROL) 10 MG tablet Take 1 tablet by mouth 2 (two) times daily before a meal.       . indomethacin (INDOCIN SR) 75 MG CR capsule Take 75 mg by mouth 2 (two) times daily as needed.      . metFORMIN (GLUCOPHAGE) 500 MG tablet Take 1 tablet by mouth 2 (two) times daily.      . metoprolol  tartrate (LOPRESSOR) 25 MG tablet Take 12.5 tablets by mouth 2 (two) times daily.      . potassium chloride (K-DUR,KLOR-CON) 10 MEQ tablet Take 1 tablet by mouth daily.      . simvastatin (ZOCOR) 40 MG tablet Take 1 tablet (40 mg total) by mouth at bedtime.  90 tablet  3  . warfarin (COUMADIN) 5 MG tablet Per INR       No current facility-administered medications for this visit.    History   Social History  . Marital Status: Single    Spouse Name: N/A    Number of Children: N/A  . Years of Education: N/A   Occupational History  . Not on file.   Social History Main Topics  . Smoking status: Former Smoker    Quit date: 07/29/2007  . Smokeless tobacco: Never Used  . Alcohol Use: 16.8 oz/week    28 Cans of beer per week  . Drug Use: No  . Sexually Active: Not on file   Other Topics Concern  . Not on file   Social History Narrative   He is a divorced father 3, grandfather of 3. He enjoys walking on the treadmill for roughly 30 minutes at a time daily. No symptoms of that. He also while systolic daily. He doesn't drink alcohol and quit smoking years ago.    ROS: A comprehensive Review of Systems - Negative except Pertinent positives above. He also denies any claudication symptoms. He's had intermittent episodes of upper respiratory tract infection but nothing recently. No GI or urinary symptoms.  PHYSICAL EXAM BP 114/62  Pulse 80  Ht 5\' 9"  (1.753 m)  Wt 166 lb 6.4 oz (75.479 kg)  BMI 24.56 kg/m2 General appearance: alert, cooperative, appears stated age, no distress and Relatively healthy-appearing. Pleasant mood and affect. Answers questions appropriately. Neck: no adenopathy, no carotid bruit, no JVD and supple, symmetrical, trachea midline Lungs: clear to auscultation bilaterally, normal percussion bilaterally and Chest distant breath sounds. Nonlabored but air movement is not brisk. Heart: irregularly irregular rhythm, S1, S2 normal, no S3 or S4, systolic murmur: systolic  ejection 1/6, crescendo, decrescendo and Early peaking at 2nd right intercostal space, radiates to carotids, no click and no rub Abdomen: soft, non-tender; bowel sounds normal; no masses,  no organomegaly Extremities: extremities normal, atraumatic, no cyanosis or edema and no ulcers, gangrene or trophic changes Pulses: 2+ and symmetric Neurologic: Grossly normal HEENT: [place]/AT, EOMI, MMM, anicteric sclera  [id number] today: Yes Rate: 80 , Rhythm: A. fib with CVR, anterolateral T wave inversions, stable -- likely repolarization abnormalities  from LVH  Recent Labs: None  ASSESSMENT / PLAN:   3-vessel CAD - 70% ISR and circumflex, diffuse 87 the RCA (nondominant), severe bifurcation LAD diagonal lesion 80 and 60% --> sent for CABG x4 He is doing really well no active symptoms. No chest pain or shortness of breath with rest or exertion just his baseline dyspnea. On beta blocker, dihydropyridine calcium channel blocker/ACE inhibitor combination, statin. Not on aspirin do to warfarin and frequent indomethacin use.  Plan: Continue current measurement.  Chronic atrial fibrillation Rate control with low-dose beta blocker. Anticoagulated on warfarin. No need to change.  HYPERLIPIDEMIA On simvastatin, however with the amlodipine we need to cut the dose from 40mg  down to 20 mg daily in addition to the fenofibrate.   HYPERTENSION Blood pressure well controlled at present on low-dose beta blocker, amlodipine but as per combination and standing Lasix.  Long term (current) use of anticoagulants On warfarin but no signs of bleeding or other issues.  SHORTNESS OF BREATH (SOB) This is probably multifactorial and in no small part due to COPD based on his CPET results. His MR along, is not significant enough to cause them any problems. One other thought is that he just isn't doing very well with age fibrillation, maybe this chronotropic incompetence as well. Do not want to titrate beta blocker dose  up. Would preferentially use calcium channel blocker for rate control as needed.   Orders Placed This Encounter  Procedures  . EKG 12-Lead   Followup: 6 months, at which time consider timing for relook echo for MR  DAVID W. Herbie Baltimore, M.D., M.S. [facility] [address] [place], [place]  [address]  [phone] Pager # [phone]

## 2013-07-31 NOTE — Assessment & Plan Note (Signed)
Rate control with low-dose beta blocker. Anticoagulated on warfarin. No need to change.

## 2013-09-07 ENCOUNTER — Ambulatory Visit: Payer: Self-pay | Admitting: Pharmacist Clinician (PhC)/ Clinical Pharmacy Specialist

## 2013-09-07 DIAGNOSIS — I482 Chronic atrial fibrillation, unspecified: Secondary | ICD-10-CM

## 2013-09-07 DIAGNOSIS — Z7901 Long term (current) use of anticoagulants: Secondary | ICD-10-CM

## 2013-11-01 ENCOUNTER — Other Ambulatory Visit: Payer: Self-pay | Admitting: Cardiology

## 2013-11-02 NOTE — Telephone Encounter (Signed)
Rx was sent to pharmacy electronically. 

## 2013-12-03 ENCOUNTER — Encounter: Payer: Self-pay | Admitting: Cardiology

## 2014-02-05 ENCOUNTER — Ambulatory Visit (INDEPENDENT_AMBULATORY_CARE_PROVIDER_SITE_OTHER): Payer: Medicare Other | Admitting: Cardiology

## 2014-02-05 ENCOUNTER — Encounter: Payer: Self-pay | Admitting: Cardiology

## 2014-02-05 VITALS — BP 108/60 | HR 70 | Ht 69.0 in | Wt 174.4 lb

## 2014-02-05 DIAGNOSIS — R0602 Shortness of breath: Secondary | ICD-10-CM

## 2014-02-05 DIAGNOSIS — I059 Rheumatic mitral valve disease, unspecified: Secondary | ICD-10-CM

## 2014-02-05 DIAGNOSIS — R0609 Other forms of dyspnea: Secondary | ICD-10-CM

## 2014-02-05 DIAGNOSIS — I340 Nonrheumatic mitral (valve) insufficiency: Secondary | ICD-10-CM

## 2014-02-05 DIAGNOSIS — I10 Essential (primary) hypertension: Secondary | ICD-10-CM

## 2014-02-05 DIAGNOSIS — I4891 Unspecified atrial fibrillation: Secondary | ICD-10-CM

## 2014-02-05 DIAGNOSIS — I482 Chronic atrial fibrillation, unspecified: Secondary | ICD-10-CM

## 2014-02-05 DIAGNOSIS — I2510 Atherosclerotic heart disease of native coronary artery without angina pectoris: Secondary | ICD-10-CM

## 2014-02-05 DIAGNOSIS — R0989 Other specified symptoms and signs involving the circulatory and respiratory systems: Secondary | ICD-10-CM

## 2014-02-05 NOTE — Patient Instructions (Addendum)
Your physician has requested that you have an echocardiogram. Echocardiography is a painless test that uses sound waves to create images of your heart. It provides your doctor with information about the size and shape of your heart and how well your heart's chambers and valves are working. This procedure takes approximately one hour. There are no restrictions for this procedure. April 2015  Your physician has requested that you have a lexiscan myoview. For further information please visit [email or web address]. Please follow instruction sheet, as given. Please do July 2015  You have been referred to pulmonologist- for DOE      Your physician wants you to follow-up in Aug 2015 Dr Herbie BaltimoreHarding.  You will receive a reminder letter in the mail two months in advance. If you don't receive a letter, please call our office to schedule the follow-up appointment.

## 2014-02-06 ENCOUNTER — Encounter: Payer: Self-pay | Admitting: Cardiology

## 2014-02-06 DIAGNOSIS — J439 Emphysema, unspecified: Secondary | ICD-10-CM | POA: Insufficient documentation

## 2014-02-06 DIAGNOSIS — I340 Nonrheumatic mitral (valve) insufficiency: Secondary | ICD-10-CM | POA: Insufficient documentation

## 2014-02-06 NOTE — Progress Notes (Signed)
PCP: Egbert Garibaldi, NP  Clinic Note: Chief Complaint  Patient presents with  . 6 month visit    no chest pain, sob, no edema    HPI: Eric Calderon is a 73 y.o. male with a Cardiovascular Problem List below who presents today for followup of his coronary disease in chronic A. fib as well as chronic dyspnea. He is a well-known patient of mine who underwent CABG back in 2006 after rapid progression of disease in the stented circumflex. He noted initially doing much better after getting the stent to the circumflex, but then after the CABG she is noted persistent, chronic exertional dyspnea. He has had multiple stress tests of different types including nuclear stress tests that were nonischemic, relatively normal echocardiogram's and most recently, a CPET test that was really not helpful from a functional standpoint due to his inability to increase heart rate and maintain accurate. We did however find out relatively significant finding that had not been present at the nursing COPD.  Interval History: He presents today again with the same complaints of exertional dyspnea just continue as a he is still being very active and doing all kinds of things, just can't do what he will do any more because of dyspnea since the circumflex. He denies any chest tightness pressure rest or exertion. No lightheadedness, dizziness, syncope or near syncope. No TIA or amaurosis fugax symptoms. Melena, hematochezia or hematuria. No palpitations or rapid/irregular heartbeat. No claudication  Past Medical History  Diagnosis Date  . CAD S/P percutaneous coronary angioplasty October 2006    CATH: 80% MID LAD, involving diagonal bifurcation; 99%  AV GROOVE  CIRCUMFLEX --> PCI of circumflex with 3.0 mm 13 and a Cypher DES; planned staged LAD PCI  . CAD, multiple vessel November 2007    LAD lesion persists, 80% pre-D1 with 60% D1 and distal down the one lesion. 70% ISR and circumflex, L. PDA 80%; nondominant RCA -  diffuse 80-70%  . 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 Exertional dyspnea August 2011    Myoview: No ischemia or infarct; echo: Normal LV size function, EF 55-60%, moderate to severe LA and RA dilation, mildly elevated RVP, mild aortic sclerosis  . Dyspnea 04/02/2013     ECHO:  LV EF  55-60%, aortic sclerosis, moderate MR, moderate to severe LA and RA dilation, increased RV pressure -mild PHtn                 . Dyslipidemia   . Diabetes mellitus without complication     2012  . Shortness of breath August 2001    MYOVIEW:NO ISCHEMIA OR INFARCT   . Chronic atrial fibrillation     Rate control with beta blocker, anticoagulated warfarin  . Hypertension   . COPD (chronic obstructive pulmonary disease)     Greater than 100 pack year smoking history, quit 2009  . Moderate mitral regurgitation April 2014    Echo    Prior Cardiac Evaluation and Past Surgical History: Past Surgical History  Procedure Laterality Date  . Cardiac catheterization  2007    CONCLUSION SIGNIFICANT THREE VESSEL CAD  2.NORMAL LEFT VENTRICULAR SYSTOLIC FUNCTION  . Coronary artery bypass graft  2008    x4  . Transthoracic echocardiogram  04/02/2013    EF 55-60%. No regional W. May. Moderate MR, moderate to severely dilated LA. Mild RV pressure increase with moderate RA dilation. Moderate TR.    Allergies  Allergen Reactions  .  Niaspan [Niacin Er]    MEDICATIONS REVIEWED IN EPIC  Notable cardiac meds: Amlodipine/enalapril 5/40 mg daily, fenofibrate 160 mg daily, simvastatin 40 mg daily, furosemide 40 mg daily, metoprolol tartrate 20 mm twice a day. Warfarin 5 mg daily.  History   Social History Narrative   He is a divorced father 3, grandfather of 3. He enjoys walking on the treadmill for roughly 30 minutes at a time daily. No symptoms of that. He also while systolic daily. He doesn't drink alcohol and quit smoking years ago.   ROS: A comprehensive Review of Systems -  Negative except The exertional dyspnea and mild musculoskeletal aches and pains.  PHYSICAL EXAM BP 108/60  Pulse 70  Ht 5\' 9"  (1.753 m)  Wt 174 lb 6.4 oz (79.107 kg)  BMI 25.74 kg/m2 General appearance: alert, cooperative, appears stated age, no distress; relatively healthy-appearing. Pleasant mood and affect Neck: no adenopathy, no carotid bruit and no JVD Lungs: Distant heart sounds, but mostly CTA B. with mild basal dry crackles, normal percussion bilaterally and non-labored Heart: regular rate and rhythm, S1, S2 normal, 1/6 crescendo-decrescendo, early peaking SEM heard at RUSB radiating to carotids. murmur; no click, rub or gallop; nondisplaced PMI Abdomen: soft, non-tender; bowel sounds normal; no masses,  no organomegaly;  Extremities: extremities normal, atraumatic, no cyanosis, or edema Pulses: 2+ and symmetric;  Neurologic: Mental status: Alert, oriented, thought content appropriate Cranial nerves: normal (II-XII grossly intact)  [id number] today: Yes Rate: 70 , Rhythm: A. fib with controlled rate. Lateral T wave inversions which is no change over several years.  Recent Labs none available, lipids are followed by PCP.:   ASSESSMENT / PLAN: 3-vessel CAD - 70% ISR and circumflex, diffuse 87 the RCA (nondominant), severe bifurcation LAD diagonal lesion 80 and 60% --> sent for CABG x4 Will active symptoms of angina related chest pain, but he does still have chronic exertional dyspnea since his CABG. It has been at least 3-1/2 years since his last Myoview. He is due for one roughly August time frame. With him having exertional dyspnea we need to consider the possibility of graft failure.  Otherwise he is on for your medical regimen with beta blocker ACE inhibitor test combination as well as statin. He is not on aspirin due to being on warfarin.  Chronic atrial fibrillation A cc relatively well rate controlled on low-dose beta blocker. He is on warfarin for prophylaxis. At this  point with significant atrial dilation on echocardiogram, I don't think this very good likelihood of restoration of sinus rhythm. Simply continue rate control for now.  HYPERTENSION Blood pressure well controlled on current medications.  DOE (dyspnea on exertion) This is a chronic finding for him. He does need a repeat echocardiogram to look for pulmonary pressures, EF as well as follow-up MR and TR. I think a good portion of what we're dealing with his COPD and perhaps the surgery caused him to be less active and more deconditioned which will be difficult to ecover from.  Plan: 2-D echocardiogram for now. Refer to pulmonary critical care for pulmonary evaluation and treatment.  Moderate mitral regurgitation Note last echocardiogram. He is due for an echocardiogram followup within the next couple months.    Orders Placed This Encounter  Procedures  . Ambulatory referral to Pulmonology    Referral Priority:  Routine    Referral Type:  Consultation    Referral Reason:  Specialty Services Required    Referred to Provider:  Coralyn Helling, MD    Requested  Specialty:  Pulmonary Disease    Number of Visits Requested:  1  . Myocardial Perfusion Imaging    DX DOE, HX CABG    Standing Status: Future     Number of Occurrences:      Standing Expiration Date: 02/05/2015    Scheduling Instructions:     July 2015    Order Specific Question:  Where should this test be performed    Answer:  [facility]    Order Specific Question:  Type of stress    Answer:  Lexiscan    Order Specific Question:  Patient weight in lbs    Answer:  174  . EKG 12-Lead  . 2D Echocardiogram without contrast    Standing Status: Future     Number of Occurrences:      Standing Expiration Date: 02/05/2015    Scheduling Instructions:     DUE IN April 2015    Order Specific Question:  Type of Echo    Answer:  Complete    Order Specific Question:  Where should this test be performed    Answer:  [facility]    Order Specific Question:  Reason for exam-Echo    Answer:  Mitral Valve Disorder  424.0   Meds ordered this encounter  Medications  . colchicine (COLCRYS) 0.6 MG tablet    Sig: Take 0.6 mg by mouth 2 (two) times daily.  . simvastatin (ZOCOR) 40 MG tablet    Sig: Take 20 mg by mouth at bedtime.  [name]. LANCETS SUPER THIN 28G MISC    Sig:   . glipiZIDE-metformin (METAGLIP) 5-500 MG per tablet    Sig: Take 1 tablet by mouth 2 (two) times daily before a meal.     Followup: 6 months  DAVID W. Herbie BaltimoreHARDING, M.D., M.S. [facility] [address] [place], [place]  [id number]  [phone] Pager # [phone]

## 2014-02-06 NOTE — Assessment & Plan Note (Signed)
This is a chronic finding for him. He does need a repeat echocardiogram to look for pulmonary pressures, EF as well as follow-up MR and TR. I think a good portion of what we're dealing with his COPD and perhaps the surgery caused him to be less active and more deconditioned which will be difficult to ecover from.  Plan: 2-D echocardiogram for now. Refer to pulmonary critical care for pulmonary evaluation and treatment.

## 2014-02-06 NOTE — Assessment & Plan Note (Signed)
Blood pressure well controlled on current medications.

## 2014-02-06 NOTE — Assessment & Plan Note (Signed)
Note last echocardiogram. He is due for an echocardiogram followup within the next couple months.

## 2014-02-06 NOTE — Assessment & Plan Note (Signed)
A cc relatively well rate controlled on low-dose beta blocker. He is on warfarin for prophylaxis. At this point with significant atrial dilation on echocardiogram, I don't think this very good likelihood of restoration of sinus rhythm. Simply continue rate control for now.

## 2014-02-06 NOTE — Assessment & Plan Note (Signed)
Will active symptoms of angina related chest pain, but he does still have chronic exertional dyspnea since his CABG. It has been at least 3-1/2 years since his last Myoview. He is due for one roughly August time frame. With him having exertional dyspnea we need to consider the possibility of graft failure.  Otherwise he is on for your medical regimen with beta blocker ACE inhibitor test combination as well as statin. He is not on aspirin due to being on warfarin.

## 2014-02-07 ENCOUNTER — Other Ambulatory Visit: Payer: Self-pay | Admitting: Internal Medicine

## 2014-02-08 NOTE — Telephone Encounter (Signed)
Rx was sent to pharmacy electronically. 

## 2014-02-23 ENCOUNTER — Encounter: Payer: Self-pay | Admitting: Pulmonary Disease

## 2014-02-23 ENCOUNTER — Ambulatory Visit (INDEPENDENT_AMBULATORY_CARE_PROVIDER_SITE_OTHER)
Admission: RE | Admit: 2014-02-23 | Discharge: 2014-02-23 | Disposition: A | Discharge status: Home / Self Care | Payer: Medicare Other | Source: Ambulatory Visit | Attending: Pulmonary Disease | Admitting: Pulmonary Disease

## 2014-02-23 ENCOUNTER — Ambulatory Visit (INDEPENDENT_AMBULATORY_CARE_PROVIDER_SITE_OTHER): Payer: Medicare Other | Admitting: Pulmonary Disease

## 2014-02-23 VITALS — BP 116/74 | HR 60 | Ht 69.0 in | Wt 174.0 lb

## 2014-02-23 DIAGNOSIS — R0989 Other specified symptoms and signs involving the circulatory and respiratory systems: Secondary | ICD-10-CM

## 2014-02-23 DIAGNOSIS — I2510 Atherosclerotic heart disease of native coronary artery without angina pectoris: Secondary | ICD-10-CM

## 2014-02-23 DIAGNOSIS — R0600 Dyspnea, unspecified: Secondary | ICD-10-CM

## 2014-02-23 DIAGNOSIS — R0609 Other forms of dyspnea: Secondary | ICD-10-CM

## 2014-02-23 NOTE — Progress Notes (Deleted)
   Subjective:    Patient ID: Eric Calderon, male    DOB: 1941-09-23, 73 y.o.   MRN: [id number]  HPI    Review of Systems  Constitutional: Negative for fever, chills, diaphoresis, activity change, appetite change, fatigue and unexpected weight change.  HENT: Positive for congestion and rhinorrhea. Negative for dental problem, ear discharge, ear pain, facial swelling, hearing loss, mouth sores, nosebleeds, postnasal drip, sinus pressure, sneezing, sore throat, tinnitus, trouble swallowing and voice change.   Eyes: Negative for photophobia, discharge, itching and visual disturbance.  Respiratory: Positive for cough and shortness of breath. Negative for apnea, choking, chest tightness, wheezing and stridor.   Cardiovascular: Negative for chest pain, palpitations and leg swelling.  Gastrointestinal: Negative for nausea, vomiting, abdominal pain, constipation, blood in stool and abdominal distention.  Genitourinary: Negative for dysuria, urgency, frequency, hematuria, flank pain, decreased urine volume and difficulty urinating.  Musculoskeletal: Negative for arthralgias, back pain, gait problem, joint swelling, myalgias, neck pain and neck stiffness.  Skin: Negative for color change, pallor and rash.  Neurological: Negative for dizziness, tremors, seizures, syncope, speech difficulty, weakness, light-headedness, numbness and headaches.  Hematological: Negative for adenopathy. Does not bruise/bleed easily.  Psychiatric/Behavioral: Negative for confusion, sleep disturbance and agitation. The patient is not nervous/anxious.        Objective:   Physical Exam        Assessment & Plan:

## 2014-02-23 NOTE — Patient Instructions (Signed)
Chest xray today Will schedule breathing test (PFT) Will get copy of lab work from Dr. Fredrik CoveMillsaps office Follow up in 3 to 4 weeks

## 2014-02-23 NOTE — Assessment & Plan Note (Signed)
He has persistent symptoms of dyspnea since 2008 after CABG.    He has extensive prior history of smoking, and previous PFT showed diffusion defect >> ? If he could have emphysema.    He has hx of CAD, valvular heart disease, and a fib >> these appear reasonably stable, and he is closely followed by cardiology.  He might have a component of deconditioning.  To further assess will repeat PFT and CXR.  Will also get lab work results from his PCP.  Will defer any therapeutics until after review of his test results.

## 2014-02-23 NOTE — Progress Notes (Signed)
Chief Complaint  Patient presents with  . Pulmonary Consult    referred by Dr. Herbie Baltimore for DOE    History of Present Illness: Eric Calderon is a 73 y.o. male former smoker evaluation of dyspnea.  He had CABG in February 2008.  He has noticed trouble with his breathing since.  He was seen by Dr. Marchelle Gearing in 2009, and there was concern for pulmonary fibrosis due to finding of isolated diffusion defect on PFT.  CT chest at that time was unremarkable, and he did not follow up with pulmonary.  He has been followed by cardiology for CAD, A fib, and valvular heart disease.  His symptoms of dyspnea have been persistent and he was advised to have further pulmonary assessment.  He is okay at rest.  He used to work in [employer] and was very active.  Now he gets winded with carrying the groceries or walking his dog.  As a result of getting winded he is not as active.  He can live with his dyspnea symptoms but would like to now what the cause is.  He occasionally gets phlegm in his throat at night.  Otherwise he denies cough, wheeze or chest pain.  He does not have breathing trouble while asleep otherwise.  He denies leg swelling, fever, sweats, or weight loss.  He denies history of asthma, emphysema, allergies, pneumonia, or exposure to tuberculosis.  He is from , but has lived in [place] for 20 years.  He smoked up to 2 packs per day for about 50 years.  He has a Development worker, international aid.  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  JAMIEL GONCALVES  has past surgical history that includes Cardiac catheterization (2007); Coronary artery bypass graft (2008); and transthoracic echocardiogram (04/02/2013).  Prior to Admission medications   Medication Sig Start Date End Date Taking? Authorizing Provider  allopurinol (ZYLOPRIM) 100 MG tablet Take 1 tablet by mouth daily. 07/19/13  Yes Historical Provider, MD  amLODipine-benazepril (LOTREL) 5-40 MG per capsule Take 1 capsule by mouth daily. 07/12/13  Yes Historical Provider, MD  colchicine (COLCRYS) 0.6 MG tablet Take 0.6 mg by mouth 2 (two) times daily.   Yes Historical Provider, MD  fenofibrate 160 MG tablet Take 1 tablet by mouth daily. 06/01/13  Yes Historical Provider, MD  furosemide (LASIX) 40 MG tablet TAKE 1 TABLET BY MOUTH DAILY 11/01/13  Yes Marykay Lex, MD  glipiZIDE-metformin (METAGLIP) 5-500 MG per tablet Take 1 tablet by mouth 2 (two) times daily before a meal.  01/06/14  Yes Historical Provider, MD  indomethacin (INDOCIN SR) 75 MG CR capsule Take 75 mg by mouth 2 (two) times daily as needed.   Yes Historical Provider, MD  LANCETS SUPER THIN 28G MISC  01/26/14  Yes Historical Provider, MD  metoprolol tartrate (LOPRESSOR) 25 MG tablet Take 12.5 tablets by mouth 2 (two) times daily. 07/05/13  Yes Historical Provider, MD  potassium chloride (K-DUR,KLOR-CON)  10 MEQ tablet TAKE 1 TABLET BY MOUTH DAILY   Yes Marykay Lexavid W Harding, MD  simvastatin (ZOCOR) 40 MG tablet Take 20 mg by mouth at bedtime. 05/15/13  Yes Marykay Lexavid W Harding, MD  warfarin (COUMADIN) 5 MG tablet Per INR 05/16/13  Yes Historical Provider, MD    Allergies  Allergen Reactions  . Niaspan [Niacin Er]     His family history includes Cancer in his father; Stroke in his mother.  He  reports that he quit smoking about 6 years ago. He has never used smokeless  tobacco. He reports that he drinks about 16.8 ounces of alcohol per week. He reports that he does not use illicit drugs.  Review of Systems  Constitutional: Negative for fever, chills, diaphoresis, activity change, appetite change, fatigue and unexpected weight change.  HENT: Positive for congestion and rhinorrhea. Negative for dental problem, ear discharge, ear pain, facial swelling, hearing loss, mouth sores, nosebleeds, postnasal drip, sinus pressure, sneezing, sore throat, tinnitus, trouble swallowing and voice change.   Eyes: Negative for photophobia, discharge, itching and visual disturbance.  Respiratory: Positive for cough and shortness of breath. Negative for apnea, choking, chest tightness, wheezing and stridor.   Cardiovascular: Negative for chest pain, palpitations and leg swelling.  Gastrointestinal: Negative for nausea, vomiting, abdominal pain, constipation, blood in stool and abdominal distention.  Genitourinary: Negative for dysuria, urgency, frequency, hematuria, flank pain, decreased urine volume and difficulty urinating.  Musculoskeletal: Negative for arthralgias, back pain, gait problem, joint swelling, myalgias, neck pain and neck stiffness.  Skin: Negative for color change, pallor and rash.  Neurological: Negative for dizziness, tremors, seizures, syncope, speech difficulty, weakness, light-headedness, numbness and headaches.  Hematological: Negative for adenopathy. Does not bruise/bleed easily.  Psychiatric/Behavioral: Negative for confusion, sleep disturbance and agitation. The patient is not nervous/anxious.    Physical Exam:  General - No distress, thin ENT - No sinus tenderness, no oral exudate, no LAN, no thyromegaly, TM clear, pupils equal/reactive, wears dentures Cardiac - s1s2 regular, no murmur, pulses symmetric Chest - No wheeze/rales/dullness, good air entry, normal respiratory excursion Back - No focal tenderness Abd - Soft, non-tender, no organomegaly, +  bowel sounds Ext - No edema Neuro - Normal strength, cranial nerves intact Skin - No rashes Psych - Normal mood, and behavior   Assessment/Plan:  Coralyn HellingVineet Fredrich Cory, MD [facility]/Sleep Pager:  [phone]

## 2014-03-01 ENCOUNTER — Telehealth: Payer: Self-pay | Admitting: Pulmonary Disease

## 2014-03-01 NOTE — Telephone Encounter (Signed)
Dg Chest 2 View  02/23/2014   CLINICAL DATA:  Shortness of breath, previous tobacco abuse  EXAM: CHEST - 2 VIEW  COMPARISON:  CT 10/26/2008 and earlier studies  FINDINGS: Stable moderate cardiomegaly. Lungs are clear, mildly hyperinflated. Atheromatous aorta. . No effusion. Previous CABG.  IMPRESSION: No acute disease post CABG.   Electronically Signed   By: Oley Balmaniel  Hassell M.D.   On: 02/23/2014 15:43   Will have my nurse inform pt that CXR shows expected changes after CABG.  No change to current treatment plan.

## 2014-03-01 NOTE — Telephone Encounter (Signed)
Pt is aware of results. 

## 2014-03-18 ENCOUNTER — Ambulatory Visit: Payer: Medicare Other | Admitting: Pulmonary Disease

## 2014-04-02 ENCOUNTER — Ambulatory Visit (INDEPENDENT_AMBULATORY_CARE_PROVIDER_SITE_OTHER): Payer: Medicare Other | Admitting: Pulmonary Disease

## 2014-04-02 ENCOUNTER — Encounter: Payer: Self-pay | Admitting: Pulmonary Disease

## 2014-04-02 VITALS — BP 120/82 | HR 72 | Ht 68.0 in | Wt 172.0 lb

## 2014-04-02 DIAGNOSIS — J438 Other emphysema: Secondary | ICD-10-CM

## 2014-04-02 DIAGNOSIS — R0989 Other specified symptoms and signs involving the circulatory and respiratory systems: Secondary | ICD-10-CM

## 2014-04-02 DIAGNOSIS — J439 Emphysema, unspecified: Secondary | ICD-10-CM

## 2014-04-02 DIAGNOSIS — R0609 Other forms of dyspnea: Secondary | ICD-10-CM

## 2014-04-02 DIAGNOSIS — R0600 Dyspnea, unspecified: Secondary | ICD-10-CM

## 2014-04-02 DIAGNOSIS — R0982 Postnasal drip: Secondary | ICD-10-CM

## 2014-04-02 DIAGNOSIS — I2510 Atherosclerotic heart disease of native coronary artery without angina pectoris: Secondary | ICD-10-CM

## 2014-04-02 MED ORDER — TRIAMCINOLONE ACETONIDE 55 MCG/ACT NA AERO: 2.0000 | INHALATION_SPRAY | Freq: Every day | NASAL | Status: DC

## 2014-04-02 MED ORDER — TIOTROPIUM BROMIDE MONOHYDRATE 18 MCG IN CAPS: 18.0000 ug | ORAL_CAPSULE | Freq: Every day | RESPIRATORY_TRACT | Status: DC

## 2014-04-02 NOTE — Progress Notes (Signed)
PFT done today. 

## 2014-04-02 NOTE — Patient Instructions (Signed)
Spiriva one puff daily Nasacort two sprays in each nostril daily as needed for sinus congestion and post-nasal drip Follow up in 2 months

## 2014-04-02 NOTE — Progress Notes (Signed)
Chief Complaint  Patient presents with  . Shortness of Breath    Breathing is unchanged. Reports DOE and coughing.. Denies chest tightness or wheezing.    History of Present Illness: Eric Calderon is a 73 y.o. 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  These show borderline obstruction, mild restriction, and moderate diffusion defect.  He continues to have cough and dyspnea.  He also has sinus congestion with post-nasal drip.  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  Eric Calderon  has past surgical history that includes Cardiac catheterization (2007); Coronary artery bypass graft (2008); and transthoracic echocardiogram (04/02/2013).  Prior to Admission medications   Medication Sig Start Date End Date Taking? Authorizing Provider  allopurinol (ZYLOPRIM) 100 MG tablet Take 1 tablet by mouth daily. 07/19/13  Yes Historical Provider, MD  amLODipine-benazepril (LOTREL) 5-40 MG per capsule Take 1 capsule by mouth daily. 07/12/13  Yes Historical Provider, MD  colchicine (COLCRYS) 0.6 MG tablet Take 0.6 mg by mouth 2 (two) times daily as needed.    Yes Historical Provider, MD  fenofibrate 160 MG tablet Take 1 tablet by mouth daily. 06/01/13  Yes  Historical Provider, MD  furosemide (LASIX) 40 MG tablet TAKE 1 TABLET BY MOUTH DAILY 11/01/13  Yes Marykay Lexavid W Harding, MD  glipiZIDE-metformin (METAGLIP) 5-500 MG per tablet Take 1 tablet by mouth 2 (two) times daily before a meal.  01/06/14  Yes Historical Provider, MD  indomethacin (INDOCIN SR) 75 MG CR capsule Take 75 mg by mouth 2 (two) times daily as needed.   Yes Historical Provider, MD  LANCETS SUPER THIN 28G MISC  01/26/14  Yes Historical Provider, MD  metoprolol tartrate (LOPRESSOR) 25 MG tablet Take 12.5 tablets by mouth 2 (two) times daily. 07/05/13  Yes Historical Provider, MD  potassium chloride (K-DUR,KLOR-CON) 10 MEQ tablet TAKE 1 TABLET BY MOUTH DAILY   Yes Marykay Lexavid W Harding, MD  simvastatin (ZOCOR) 40 MG tablet Take 20 mg by mouth at bedtime. 05/15/13  Yes Marykay Lexavid W Harding, MD  warfarin (COUMADIN) 5 MG tablet Per INR 05/16/13  Yes Historical Provider, MD    Allergies  Allergen Reactions  . Niaspan [Niacin Er]      Physical Exam:  General - No distress ENT - No sinus tenderness, no oral exudate, no LAN, wears dentures Cardiac - s1s2 regular, no murmur Chest - No wheeze/rales/dullness Back - No focal tenderness Abd - Soft, non-tender Ext - No edema Neuro - Normal strength Skin - No rashes Psych - normal mood, and behavior  Dg Chest 2 View  02/23/2014   CLINICAL DATA:  Shortness of breath, previous tobacco abuse  EXAM: CHEST - 2 VIEW  COMPARISON:  CT 10/26/2008 and earlier studies  FINDINGS: Stable moderate cardiomegaly. Lungs are clear, mildly hyperinflated. Atheromatous aorta. .[name]  No effusion. Previous CABG.  IMPRESSION: No acute disease post CABG.   Electronically Signed   By: Oley Balm M.D.   On: 02/23/2014 15:43    Assessment/Plan:  Coralyn Helling, MD [facility]/Sleep Pager:  [phone]

## 2014-04-04 DIAGNOSIS — R0982 Postnasal drip: Secondary | ICD-10-CM | POA: Insufficient documentation

## 2014-04-04 NOTE — Assessment & Plan Note (Signed)
Advised that he could try using nasacort prn.

## 2014-04-04 NOTE — Assessment & Plan Note (Signed)
He has significant history of smoking.  He has borderline obstruction, but moderate diffusion defect on PFT.  His recent chest xray was negative for interstitial lung disease.    I will give him trial of spiriva, and then re-assess how this helps his symptoms of dyspnea.

## 2014-05-02 ENCOUNTER — Other Ambulatory Visit: Payer: Self-pay | Admitting: Cardiovascular Disease

## 2014-07-06 ENCOUNTER — Other Ambulatory Visit: Payer: Self-pay | Admitting: Cardiology

## 2014-07-23 LAB — PULMONARY FUNCTION TEST
DL/VA % PRED: 79 %
DL/VA: 3.54 ml/min/mmHg/L
DLCO unc % pred: 56 %
DLCO unc: 16.82 ml/min/mmHg
FEF 25-75 PRE: 1.39 L/s
FEF 25-75 Post: 1.36 L/sec
FEF2575-%Change-Post: -2 %
FEF2575-%Pred-Post: 62 %
FEF2575-%Pred-Pre: 64 %
FEV1-%Change-Post: 0 %
FEV1-%PRED-POST: 71 %
FEV1-%PRED-PRE: 71 %
FEV1-PRE: 2.07 L
FEV1-Post: 2.07 L
FEV1FVC-%Change-Post: 4 %
FEV1FVC-%Pred-Pre: 99 %
FEV6-%Change-Post: -3 %
FEV6-%PRED-POST: 71 %
FEV6-%Pred-Pre: 74 %
FEV6-Post: 2.7 L
FEV6-Pre: 2.81 L
FEV6FVC-%Change-Post: 0 %
FEV6FVC-%PRED-PRE: 105 %
FEV6FVC-%Pred-Post: 106 %
FVC-%Change-Post: -4 %
FVC-%Pred-Post: 67 %
FVC-%Pred-Pre: 71 %
FVC-Post: 2.71 L
FVC-Pre: 2.84 L
POST FEV1/FVC RATIO: 76 %
Post FEV6/FVC ratio: 100 %
Pre FEV1/FVC ratio: 73 %
Pre FEV6/FVC Ratio: 99 %
RV % pred: 84 %
RV: 2.03 L
TLC % pred: 76 %
TLC: 5.1 L

## 2014-08-02 ENCOUNTER — Other Ambulatory Visit: Payer: Self-pay | Admitting: Cardiology

## 2014-08-02 NOTE — Telephone Encounter (Signed)
Rx refill sent to patient pharmacy   

## 2014-08-24 HISTORY — PX: NM MYOVIEW LTD: HXRAD82

## 2014-08-28 ENCOUNTER — Other Ambulatory Visit: Payer: Self-pay | Admitting: Cardiology

## 2014-08-31 NOTE — Telephone Encounter (Signed)
Rx refill sent to patient pharmacy   

## 2014-09-01 ENCOUNTER — Encounter: Payer: Self-pay | Admitting: Cardiology

## 2014-09-01 ENCOUNTER — Ambulatory Visit (INDEPENDENT_AMBULATORY_CARE_PROVIDER_SITE_OTHER): Payer: Medicare Other | Admitting: Cardiology

## 2014-09-01 VITALS — BP 120/70 | HR 73 | Ht 68.0 in | Wt 167.5 lb

## 2014-09-01 DIAGNOSIS — R0609 Other forms of dyspnea: Secondary | ICD-10-CM

## 2014-09-01 DIAGNOSIS — I340 Nonrheumatic mitral (valve) insufficiency: Secondary | ICD-10-CM

## 2014-09-01 DIAGNOSIS — R0989 Other specified symptoms and signs involving the circulatory and respiratory systems: Secondary | ICD-10-CM

## 2014-09-01 DIAGNOSIS — R0602 Shortness of breath: Secondary | ICD-10-CM

## 2014-09-01 DIAGNOSIS — I4891 Unspecified atrial fibrillation: Secondary | ICD-10-CM

## 2014-09-01 DIAGNOSIS — I10 Essential (primary) hypertension: Secondary | ICD-10-CM

## 2014-09-01 DIAGNOSIS — I2510 Atherosclerotic heart disease of native coronary artery without angina pectoris: Secondary | ICD-10-CM

## 2014-09-01 DIAGNOSIS — E785 Hyperlipidemia, unspecified: Secondary | ICD-10-CM

## 2014-09-01 DIAGNOSIS — I482 Chronic atrial fibrillation, unspecified: Secondary | ICD-10-CM

## 2014-09-01 DIAGNOSIS — I059 Rheumatic mitral valve disease, unspecified: Secondary | ICD-10-CM

## 2014-09-01 MED ORDER — SIMVASTATIN 40 MG PO TABS: 40.0000 mg | ORAL_TABLET | Freq: Every evening | ORAL | Status: DC

## 2014-09-01 NOTE — Patient Instructions (Addendum)
Your physician has requested that you have en exercise stress myoview. For further information please visit [email or web address] Please follow instruction sheet, as given.   Your physician wants you to follow-up in 3 month Dr Herbie Baltimore.  You will receive a reminder letter in the mail two months in advance. If you don't receive a letter, please call our office to schedule the follow-up appointment.

## 2014-09-02 ENCOUNTER — Telehealth (HOSPITAL_COMMUNITY): Payer: Self-pay

## 2014-09-02 NOTE — Telephone Encounter (Signed)
Encounter complete. 

## 2014-09-03 ENCOUNTER — Ambulatory Visit (HOSPITAL_COMMUNITY)
Admission: RE | Admit: 2014-09-03 | Discharge: 2014-09-03 | Disposition: A | Discharge status: Home / Self Care | Payer: Medicare Other | Source: Ambulatory Visit | Attending: Cardiology | Admitting: Cardiology

## 2014-09-03 DIAGNOSIS — Z87891 Personal history of nicotine dependence: Secondary | ICD-10-CM | POA: Diagnosis not present

## 2014-09-03 DIAGNOSIS — I482 Chronic atrial fibrillation, unspecified: Secondary | ICD-10-CM

## 2014-09-03 DIAGNOSIS — J4489 Other specified chronic obstructive pulmonary disease: Secondary | ICD-10-CM | POA: Insufficient documentation

## 2014-09-03 DIAGNOSIS — I10 Essential (primary) hypertension: Secondary | ICD-10-CM | POA: Diagnosis not present

## 2014-09-03 DIAGNOSIS — R0602 Shortness of breath: Secondary | ICD-10-CM | POA: Diagnosis not present

## 2014-09-03 DIAGNOSIS — J449 Chronic obstructive pulmonary disease, unspecified: Secondary | ICD-10-CM | POA: Insufficient documentation

## 2014-09-03 DIAGNOSIS — I2510 Atherosclerotic heart disease of native coronary artery without angina pectoris: Secondary | ICD-10-CM | POA: Diagnosis present

## 2014-09-03 DIAGNOSIS — E785 Hyperlipidemia, unspecified: Secondary | ICD-10-CM | POA: Insufficient documentation

## 2014-09-03 DIAGNOSIS — Z951 Presence of aortocoronary bypass graft: Secondary | ICD-10-CM | POA: Diagnosis not present

## 2014-09-03 DIAGNOSIS — I4891 Unspecified atrial fibrillation: Secondary | ICD-10-CM

## 2014-09-03 DIAGNOSIS — E119 Type 2 diabetes mellitus without complications: Secondary | ICD-10-CM | POA: Diagnosis not present

## 2014-09-03 MED ORDER — TECHNETIUM TC 99M SESTAMIBI GENERIC - CARDIOLITE
31.0000 | Freq: Once | INTRAVENOUS | Status: AC | PRN
  Administered 2014-09-03: 31 via INTRAVENOUS

## 2014-09-03 MED ORDER — TECHNETIUM TC 99M SESTAMIBI GENERIC - CARDIOLITE
10.8000 | Freq: Once | INTRAVENOUS | Status: AC | PRN
  Administered 2014-09-03: 11 via INTRAVENOUS

## 2014-09-03 MED ORDER — REGADENOSON 0.4 MG/5ML IV SOLN
0.4000 mg | Freq: Once | INTRAVENOUS | Status: AC
  Administered 2014-09-03: 0.4 mg via INTRAVENOUS

## 2014-09-03 MED ORDER — AMINOPHYLLINE 25 MG/ML IV SOLN
75.0000 mg | Freq: Once | INTRAVENOUS | Status: AC
  Administered 2014-09-03: 75 mg via INTRAVENOUS

## 2014-09-03 NOTE — Assessment & Plan Note (Signed)
Very frustrating that we have not yet figured out what her symptoms are related to. It seems like treating potential pulmonary issues has not been helpful. He had a CPET-MET test that was suboptimal because of inability to reach target heart rate. He had a very low peak VO2 but was difficult to interpret. High on my differential if not pulmonary include chronotropic incompetence and ischemic coronary artery disease with dyspnea being the symptom and not angina.  Plan: Treadmill Myoview -- the plan is for him to perform the treadmill portion for long symptomatic possible and monitor for his change in heart rate and EKG. If he does not meet the target, he will switch to a lesser scan and or to obtain sensitive and specific imaging data. The packet the peak VO2 was low may very well be indicative of ischemic coronary disease.

## 2014-09-03 NOTE — Assessment & Plan Note (Signed)
The plan is to evaluate this with an echocardiogram. I think if the nuclear stress test is not revealing we will go ahead and check the echocardiogram, the off chance that the MR is worse and therefore potentially leading to dyspnea.

## 2014-09-03 NOTE — Assessment & Plan Note (Signed)
Controlled on current regimen; as noted above May consider switching from ACE inhibitor to ARB as a last stitch effort to alleviate dyspnea

## 2014-09-03 NOTE — Assessment & Plan Note (Signed)
Remains on statin that was supposed to be a decreased dose of simvastatin 20 mg. I think he is taking half a pill. He is also taking fenofibrate.

## 2014-09-03 NOTE — Progress Notes (Signed)
Quick Note:  Stress Test looked good!! No sign of significant Heart Artery Disease. Pump function is normal. Can't blame CAD for dyspnea based upon this.  Good news!!.  Marykay Lex, MD  ______

## 2014-09-03 NOTE — Assessment & Plan Note (Addendum)
He hasn't had anginal pain since his bypass surgery but really has been dyspneic ever since. We have not done an ischemic evaluation since 2011. He would be due for surveillance stress test this year, and with his persistent exertional dyspnea,  this would be a good opportunity to actually evaluate a concerning symptom.  Otherwise from a medical standpoint he is on a pretty good regimen. He is on a low-dose beta blocker which may need to be stopped depending on what his target heart rate becomes. He is on a cast which I walker/ACE inhibitor combination addition to statin. One other consideration would be to switch from an ACE inhibitor to an ARB.

## 2014-09-03 NOTE — Assessment & Plan Note (Signed)
He continues to be in A. fib, and may simply have sick sinus syndrome with inability to increase his heart rate. Based on the back of his head evidence of dilated left atrium on echo, and topical twice a day to back out of A. fib. Rate control as they're the best option in doing so the low-dose beta 1 selective beta blocker has worked. We'll assess his responsiveness to treadmill exercise as far as ability to increase heart rate.

## 2014-09-03 NOTE — Procedures (Addendum)
[facility] [address] [place] [address] [phone]  Cardiology Nuclear Med Study  Eric Calderon is a 73 y.o. male     MRN : [id number]     DOB: 1941-04-14  Procedure Date: 09/03/2014  Nuclear Med Background Indication for Stress Test:  Graft Patency History:  COPD, Emphysema and CAD;CABG X4-01/2007;STENT/PTCA-2006;CHRONIC A-FIB;MODERATE MITRAL REGURGITATION;CHRONOTROPIC COMPETENCY;Last NUC MPI on 08/21/2010-nonischemic;EF Not gated;ECHO on 04/02/2013-LVEF=55-60% Cardiac Risk Factors: History of Smoking, Hypertension, Lipids and NIDDM  Symptoms:  DOE, Light-Headedness and SOB   Nuclear Pre-Procedure Caffeine/Decaff Intake:  7:00pm NPO After: 5:00am   IV Site: R Forearm  IV 0.9% NS with Angio Cath:  22g  Chest Size (in):  42"  IV Started by: Berdie Ogren, RN  Height:  (1.727 m)  Cup Size: n/a  BMI:  Body mass index is 25.4 kg/(m^2). Weight:  167 lb (75.751 kg)   Tech Comments: Patient stated he can not walk on treadmill due to severe hip pain. Refuses treadmill. He requested to have the Lexiscan.    Nuclear Med Study 1 or 2 day study: 1 day  Stress Test Type:  Lexiscan  Order Authorizing Provider:  Bryan Lemma, MD   Resting Radionuclide: Technetium 66m Sestamibi  Resting Radionuclide Dose: 10.8 mCi   Stress Radionuclide:  Technetium 12m Sestamibi  Stress Radionuclide Dose: 31.0 mCi           Stress Protocol Rest HR: 67 Stress HR: 83  Rest BP: 156/76 Stress BP:156/76  Exercise Time (min): n/a METS: n/a          Dose of Adenosine (mg):  n/a Dose of Lexiscan: 0.4 mg  Dose of Atropine (mg): n/a Dose of Dobutamine: n/a mcg/kg/min (at max HR)  Stress Test Technologist: Ernestene Mention, CCT Nuclear Technologist: Gonzella Lex, CNMT   Rest Procedure:  Myocardial perfusion imaging was performed at rest 45 minutes following the intravenous administration of Technetium 36m Sestamibi. Stress Procedure:  The  patient received IV Lexiscan 0.4 mg over 15-seconds.  Technetium 17m Sestamibi injected IV at 30-seconds.  Patient experienced shortness of breath and was administered 75 mg of Aminophylline IV at 5 minutes. There were no significant changes with Lexiscan.  Quantitative spect images were obtained after a 45 minute delay.  Transient Ischemic Dilatation (Normal <1.22):  1.25  QGS EDV:  n/a ml QGS ESV:  n/a ml LV Ejection Fraction: Study not gated        Rest ECG: Atrial Fibrilliation and LVH with repol changes  Stress ECG: No significant change from baseline ECG  QPS Raw Data Images:  Normal; no motion artifact; normal heart/lung ratio. Stress Images:  Normal homogeneous uptake in all areas of the myocardium. Rest Images:  Normal homogeneous uptake in all areas of the myocardium. Subtraction (SDS):  No evidence of ischemia.  Impression Exercise Capacity:  Lexiscan with no exercise. BP Response:  Normal blood pressure response. Clinical Symptoms:  No significant symptoms noted. ECG Impression:  No significant ST segment change suggestive of ischemia. Comparison with Prior Nuclear Study: No significant change from previous study  Overall Impression:  Normal stress nuclear study.  LV Wall Motion:  Would not gate secondary to Afib   [name], MD  09/03/2014 1:16 PM

## 2014-09-03 NOTE — Progress Notes (Signed)
 PCP: Millsaps, KIMBERLY M, NP  Clinic Note: Chief Complaint  Patient presents with  . Follow-up    6 mth visit. pt denies chest pain and swelling. He stated that he always has sob.   HPI: Eric Calderon is a 72 y.o. male with a Cardiovascular Problem List below who presents today for six-month followup of his CAD and chronic A. fib with chronic dyspnea. I last saw him in February 2015, he was referred to pulmonary medicine and we ordered an echocardiogram and stress test that have yet to be performed. The echocardiogram was made intended to evaluate his mitral regurgitation and the stress test was to evaluate for possible ischemic etiology for his chronic dyspnea.  Interval History: He comes in today really with no major complaints he says he still has the same baseline dyspnea. He went to go see Dr. Sood from pulmonary medicine who tried multiple different pulmonary medicines that did not provide any relief. Still has his basic exertional dyspnea a little more than usual but is something that he is not paying much attention to.  He says he just can't get going without losing his breath. It is as if he cannot keep up with himself. He denies any anginal symptoms at rest or exertion that worse the symptoms that led to his initial cardiac catheterization. He denies any PND orthopnea or edema. No rapid or irregular heartbeat/palpitations. No syncope or near syncope symptoms, TIA or amaurosis fugax symptoms. No claudication. He also denies any significant coughing or wheezing or allergy symptoms.  Past Medical History  Diagnosis Date  . CAD S/P percutaneous coronary angioplasty October 2006    CATH: 80% MID LAD, involving diagonal bifurcation; 99%  AV GROOVE  CIRCUMFLEX --> PCI of circumflex with 3.0 mm 13 and a Cypher DES; planned staged LAD PCI  . CAD, multiple vessel November 2007    LAD lesion persists, 80% pre-D1 with 60% D1 and distal down the one lesion. 70% ISR and circumflex, L. PDA 80%;  nondominant RCA - diffuse 80-70%  . 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 Exertional dyspnea August 2011    Myoview: No ischemia or infarct; echo: Normal LV size function, EF 55-60%, moderate to severe LA and RA dilation, mildly elevated RVP, mild aortic sclerosis  . Dyspnea 04/02/2013     ECHO:  LV EF  55-60%, aortic sclerosis, moderate MR, moderate to severe LA and RA dilation, increased RV pressure -mild PHtn                 . Dyslipidemia   . Diabetes mellitus without complication     2012  . Shortness of breath August 2001    MYOVIEW:NO ISCHEMIA OR INFARCT   . Chronic atrial fibrillation     Rate control with beta blocker, anticoagulated warfarin  . Hypertension   . COPD (chronic obstructive pulmonary disease)     Greater than 100 pack year smoking history, quit 2009  . Moderate mitral regurgitation April 2014    Echo    Prior Cardiac Evaluation and Past Surgical History: Past Surgical History  Procedure Laterality Date  . Cardiac catheterization  2007    CONCLUSION SIGNIFICANT THREE VESSEL CAD  2.NORMAL LEFT VENTRICULAR SYSTOLIC FUNCTION  . Coronary artery bypass graft  2008    x4  . Transthoracic echocardiogram  04/02/2013    EF 55-60%. No regional W. May. Moderate MR, moderate to severely dilated LA. Mild RV pressure increase   with moderate RA dilation. Moderate TR.   MEDICATIONS AND ALLERGIES REVIEWED IN EPIC No Change in Social and Family History  ROS: A comprehensive Review of Systems - was performed Review of Systems  Constitutional: Negative for malaise/fatigue and diaphoresis.  HENT: Negative for nosebleeds.   Eyes: Negative for blurred vision and double vision.  Respiratory: Positive for shortness of breath. Negative for cough, hemoptysis, sputum production and wheezing.   Cardiovascular: Negative.        Per history of present illness  Gastrointestinal: Negative for constipation, blood in stool and melena.  Genitourinary:  Negative for dysuria, hematuria and flank pain.  Musculoskeletal: Positive for joint pain.  Neurological: Negative for dizziness, speech change, focal weakness, loss of consciousness and weakness.  Psychiatric/Behavioral: Negative for depression. The patient is not nervous/anxious.   All other systems reviewed and are negative.   Wt Readings from Last 3 Encounters:  09/01/14 167 lb 8 oz (75.978 kg)  04/02/14 172 lb (78.019 kg)  02/23/14 174 lb (78.926 kg)    PHYSICAL EXAM BP 120/70  Pulse 73  Ht 5' 8" (1.727 m)  Wt 167 lb 8 oz (75.978 kg)  BMI 25.47 kg/m2 General appearance: alert, cooperative, appears stated age, no distress; relatively healthy-appearing. Pleasant mood and affect  Neck: no adenopathy, no carotid bruit and no JVD  Lungs: Distant heart sounds, but mostly CTA B. with mild basal dry crackles, normal percussion bilaterally and non-labored  Heart: RRR, S1, S2 normal, 1/6 c-d, early peaking SEM heard at RUSB radiating to carotids. murmur; no click, rub or gallop; nondisplaced PMI  Abdomen: soft, non-tender; bowel sounds normal; no masses, no organomegaly;  Extremities: extremities normal, atraumatic, no cyanosis, or edema  Pulses: 2+ and symmetric;  Neurologic: Mental status: Alert, oriented, thought content appropriate  Cranial nerves: normal (II-XII grossly intact)   Adult ECG Report  Rate: 73 ;  Rhythm: atrial fibrillation and Controlled ventricular response. Nonspecific ST and T-wave changes but no significant change from prior EKGs  Recent Labs not available:   ASSESSMENT / PLAN: Exertional dyspnea Very frustrating that we have not yet figured out what her symptoms are related to. It seems like treating potential pulmonary issues has not been helpful. He had a CPET-MET test that was suboptimal because of inability to reach target heart rate. He had a very low peak VO2 but was difficult to interpret. High on my differential if not pulmonary include chronotropic  incompetence and ischemic coronary artery disease with dyspnea being the symptom and not angina.  Plan: Treadmill Myoview -- the plan is for him to perform the treadmill portion for long symptomatic possible and monitor for his change in heart rate and EKG. If he does not meet the target, he will switch to a lesser scan and or to obtain sensitive and specific imaging data. The packet the peak VO2 was low may very well be indicative of ischemic coronary disease.  3-vessel CAD - 70% ISR and circumflex, diffuse 87 the RCA (nondominant), severe bifurcation LAD diagonal lesion 80 and 60% --> sent for CABG x4 He hasn't had anginal pain since his bypass surgery but really has been dyspneic ever since. We have not done an ischemic evaluation since 2011. He would be due for surveillance stress test this year, and with his persistent exertional dyspnea,  this would be a good opportunity to actually evaluate a concerning symptom.  Otherwise from a medical standpoint he is on a pretty good regimen. He is on a low-dose beta blocker which  may need to be stopped depending on what his target heart rate becomes. He is on a cast which I walker/ACE inhibitor combination addition to statin. One other consideration would be to switch from an ACE inhibitor to an ARB.  Chronic atrial fibrillation He continues to be in A. fib, and may simply have sick sinus syndrome with inability to increase his heart rate. Based on the back of his head evidence of dilated left atrium on echo, and topical twice a day to back out of A. fib. Rate control as they're the best option in doing so the low-dose beta 1 selective beta blocker has worked. We'll assess his responsiveness to treadmill exercise as far as ability to increase heart rate.  Moderate mitral regurgitation The plan is to evaluate this with an echocardiogram. I think if the nuclear stress test is not revealing we will go ahead and check the echocardiogram, the off chance that the  MR is worse and therefore potentially leading to dyspnea.  HYPERTENSION Controlled on current regimen; as noted above May consider switching from ACE inhibitor to ARB as a last stitch effort to alleviate dyspnea  Hyperlipidemia with target LDL less than 70 Remains on statin that was supposed to be a decreased dose of simvastatin 20 mg. I think he is taking half a pill. He is also taking fenofibrate.    Orders Placed This Encounter  Procedures  . Myocardial Perfusion Imaging    Standing Status: Future     Number of Occurrences:      Standing Expiration Date: 09/01/2015    Scheduling Instructions:     Patient need try to do treadmill portion as much as possible     Looking for chronotropic competency    Order Specific Question:  Where should this test be performed    Answer:  [facility]    Order Specific Question:  Type of stress    Answer:  Exercise    Order Specific Question:  Patient weight in lbs    Answer:  167  . EKG 12-Lead   Meds ordered this encounter  Medications  . glipiZIDE (GLUCOTROL XL) 10 MG 24 hr tablet    Sig:   . JANUMET 50-1000 MG per tablet    Sig:   . simvastatin (ZOCOR) 40 MG tablet    Sig: Take 1 tablet (40 mg total) by mouth Nightly.    Dispense:  90 tablet    Refill:  3    Place own file    Followup: 3 months  DAVID W. HARDING, M.D., M.S. Interventional Cardiologist [facility]    

## 2014-09-06 ENCOUNTER — Telehealth: Payer: Self-pay | Admitting: *Deleted

## 2014-09-06 NOTE — Telephone Encounter (Signed)
Message copied by Tobin Chad on Mon Sep 06, 2014  5:52 PM ------      Message from: Marykay Lex      Created: Fri Sep 03, 2014  5:53 PM       Stress Test looked good!! No sign of significant Heart Artery Disease.  Pump function is normal.      Can't blame CAD for dyspnea based upon this.            Good news!!.            Marykay Lex, MD       ------

## 2014-09-06 NOTE — Telephone Encounter (Signed)
Spoke to patient. Result given . Verbalized understanding  

## 2014-11-29 ENCOUNTER — Ambulatory Visit (INDEPENDENT_AMBULATORY_CARE_PROVIDER_SITE_OTHER): Payer: Medicare Other | Admitting: Cardiology

## 2014-11-29 ENCOUNTER — Encounter: Payer: Self-pay | Admitting: Cardiology

## 2014-11-29 VITALS — BP 100/62 | HR 64 | Ht 69.0 in | Wt 169.0 lb

## 2014-11-29 DIAGNOSIS — I482 Chronic atrial fibrillation, unspecified: Secondary | ICD-10-CM

## 2014-11-29 DIAGNOSIS — Z7901 Long term (current) use of anticoagulants: Secondary | ICD-10-CM

## 2014-11-29 DIAGNOSIS — J439 Emphysema, unspecified: Secondary | ICD-10-CM

## 2014-11-29 DIAGNOSIS — I10 Essential (primary) hypertension: Secondary | ICD-10-CM

## 2014-11-29 DIAGNOSIS — E785 Hyperlipidemia, unspecified: Secondary | ICD-10-CM

## 2014-11-29 DIAGNOSIS — I2510 Atherosclerotic heart disease of native coronary artery without angina pectoris: Secondary | ICD-10-CM

## 2014-11-29 DIAGNOSIS — R0609 Other forms of dyspnea: Secondary | ICD-10-CM

## 2014-11-29 MED ORDER — POTASSIUM CHLORIDE CRYS ER 10 MEQ PO TBCR: 10.0000 meq | EXTENDED_RELEASE_TABLET | Freq: Every day | ORAL | Status: DC

## 2014-11-29 MED ORDER — FUROSEMIDE 40 MG PO TABS
40.0000 mg | ORAL_TABLET | Freq: Every day | ORAL | Status: DC
Start: 2014-11-29 — End: 2016-01-24

## 2014-11-29 MED ORDER — SIMVASTATIN 40 MG PO TABS: 40.0000 mg | ORAL_TABLET | Freq: Every evening | ORAL | Status: DC

## 2014-11-29 NOTE — Assessment & Plan Note (Signed)
Originally started on Spiriva by pulmonary medicine. I don't see that as currently listed on his medication regimen however.

## 2014-11-29 NOTE — Assessment & Plan Note (Signed)
INR to be checked

## 2014-11-29 NOTE — Patient Instructions (Signed)
Your physician has recommended making the following medication changes:  START taking your Metoprolol at night for 1 week. If you have no problems taking this medication at night, you make continue.  Dr Herbie BaltimoreHarding wants you to follow-up in 6 months. You will receive a reminder letter in the mail one months in advance. If you don't receive a letter, please call our office to schedule the follow-up appointment.

## 2014-11-29 NOTE — Progress Notes (Signed)
PCP: Egbert GaribaldiMillsaps, KIMBERLY M, NP  Clinic Note: No chief complaint on file.  HPI: Eric Calderon is a 73 y.o. male with a Cardiovascular Problem List below who presents today for six-month followup of his CAD and chronic A. fib with chronic dyspnea. I last saw him in February 2015, he was referred to pulmonary medicine and we ordered an echocardiogram and stress test that have yet to be performed that we finally ordered after seeing him in September.   He did not walk on the TM, but did a [employer] Myoview.  Says that he has had this dyspnea since CABG.  Myoview was non-ischemic.   Interval History: Still has the exertional dyspnea -- just a frustration.  Just annoys him that he cannot do what he used to do.  No CP with rest or exertion.has been any change in his dyspnea. He relates that he probably just began to notice dyspnea around the time of his CABG. He has been a nonsmoker for several years but was a long-term smoker and does have COPD at baseline. In the past an echocardiogram did not suggest a potential for constrictive pericarditis or restrictive cardiomyopathy. His EF has been relatively normal by both echo and nuclear stress test.  As usual, he denies any anginal symptoms at rest or exertion that worse the symptoms that led to his initial cardiac catheterization. No CHF symptoms of PND, orthopnea or edema. No rapid or irregular heartbeat/palpitations. No syncope or near syncope symptoms, TIA or amaurosis fugax symptoms.  He also denies any significant coughing or wheezing or allergy symptoms.  Past Medical History  Diagnosis Date  . CAD S/P percutaneous coronary angioplasty October 2006    CATH: 80% MID LAD, involving diagonal bifurcation; 99%  AV GROOVE  CIRCUMFLEX --> PCI of circumflex with 3.0 mm 13 and a Cypher DES; planned staged LAD PCI  . CAD, multiple vessel November 2007    LAD lesion persists, 80% pre-D1 with 60% D1 and distal down the one lesion. 70% ISR and circumflex, L.  PDA 80%; nondominant RCA - diffuse 80-70%  . 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 Exertional dyspnea Since 2006    a) 8/'11 Myoview: No Ischemic/Infarct; Echo: Nl LV size & fxn, EF 55-60%, mod-Severe LA & RA dilation,mild AoV sclerosis, Mild PHTN;; 4/'14 Echo: LV EF  55-60%, aortic sclerosis, moderate MR, moderate to severe LA and RA dilation, increased RV pressure -mild PHtn; 9/'15: Lexiscan Myovew: No Ischmia or Infarct, Afib - no EF.  . Moderate mitral regurgitation 03/2013    Echo - above  . Diabetes mellitus without complication     2012  . Chronic atrial fibrillation     Rate control with beta blocker, anticoagulated warfarin  . Essential hypertension   . COPD (chronic obstructive pulmonary disease)     Greater than 100 pack year smoking history, quit 2009  . Hyperlipidemia with target LDL less than 70     Prior Cardiac Evaluation and Past Surgical History: Past Surgical History  Procedure Laterality Date  . Cardiac catheterization  2007    CONCLUSION SIGNIFICANT THREE VESSEL CAD  2.NORMAL LEFT VENTRICULAR SYSTOLIC FUNCTION  . Coronary artery bypass graft  2008    x4  . Transthoracic echocardiogram  04/02/2013    EF 55-60%. No regional W. May. Moderate MR, moderate to severely dilated LA. Mild RV pressure increase with moderate RA dilation. Moderate TR.   Allergies  Allergen Reactions  . Niaspan [  Niacin Er]    Current Outpatient Prescriptions on File Prior to Visit  Medication Sig Dispense Refill  . allopurinol (ZYLOPRIM) 100 MG tablet Take 1 tablet by mouth daily.    [name] amLODipine-benazepril (LOTREL) 5-40 MG per capsule Take 1 capsule by mouth daily.    . fenofibrate 160 MG tablet Take 1 tablet by mouth daily.    [name] glipiZIDE (GLUCOTROL XL) 10 MG 24 hr tablet     . glipiZIDE-metformin (METAGLIP) 5-500 MG per tablet Take 1 tablet by mouth 2 (two) times daily before a meal.     . indomethacin (INDOCIN SR) 75 MG CR capsule Take 75 mg by  mouth 2 (two) times daily as needed.    [name] JANUMET 50-1000 MG per tablet     . LANCETS SUPER THIN 28G MISC     . metoprolol tartrate (LOPRESSOR) 25 MG tablet TAKE 1/2 TABLET BY MOUTH TWICE DAILY 90 tablet 3  . warfarin (COUMADIN) 5 MG tablet Per INR     No current facility-administered medications on file prior to visit.    No Change in Social and Family History  ROS: A comprehensive Review of Systems - was performed Review of Systems  Constitutional: Negative for malaise/fatigue and diaphoresis.  HENT: Negative for nosebleeds.   Eyes: Negative for blurred vision and double vision.  Respiratory: Positive for shortness of breath. Negative for cough, hemoptysis, sputum production and wheezing.   Cardiovascular: Negative.        Per history of present illness  Gastrointestinal: Negative for constipation, blood in stool and melena.  Genitourinary: Negative for dysuria, hematuria and flank pain.  Musculoskeletal: Positive for joint pain.  Neurological: Negative for dizziness, speech change, focal weakness, loss of consciousness and weakness.  Psychiatric/Behavioral: Negative for depression. The patient is not nervous/anxious.   All other systems reviewed and are negative.   Wt Readings from Last 3 Encounters:  11/29/14 169 lb (76.658 kg)  09/03/14 167 lb (75.751 kg)  09/01/14 167 lb 8 oz (75.978 kg)    PHYSICAL EXAM BP 100/62 mmHg  Pulse 64  Ht 5\' 9"  (1.753 m)  Wt 169 lb (76.658 kg)  BMI 24.95 kg/m2 General appearance: alert, cooperative, appears stated age, no distress; relatively healthy-appearing. Pleasant mood and affect  Neck: no adenopathy, no carotid bruit and no JVD  Lungs: Distant heart sounds, but mostly CTA B. with mild basal dry crackles, normal percussion bilaterally and non-labored  Heart: RRR, S1, S2 normal, 1/6 c-d, early peaking SEM heard at RUSB radiating to carotids. murmur; no click, rub or gallop; nondisplaced PMI  Abdomen: soft, non-tender; bowel sounds  normal; no masses, no organomegaly;  Extremities: extremities normal, atraumatic, no cyanosis, or edema  Pulses: 2+ and symmetric;  Neurologic: Mental status: Alert, oriented, thought content appropriate  Cranial nerves: normal (II-XII grossly intact)    Adult ECG Report  Rate: 73 ;  Rhythm: atrial fibrillation and Controlled ventricular response. Nonspecific ST and T-wave changes but no significant change from prior EKGs  Recent Labs not available:   ASSESSMENT / PLAN: Exertional dyspnea Unfortunately, I didn't get the desired effect from the stress test and that he did not walk on treadmill. Clearly this obstruction was not read. He thought that with his hip bothering him he wasn't available walk. He never mentioned anything to me about that being an issue before I order it. Unfortunately though we don't have his chronotropic response. No ischemia with normal EF is consistent with his 2011 evaluation. Echocardiographic evidence is not suggested  restrictive physiology or constrictive peritonitis. I think probably dealing just with baseline COPD and probably some diastolic dysfunction although again echocardiographically not significant  This is is chronic, and I think he will just have to work through it.  3-vessel CAD - 70% ISR and circumflex, diffuse 87 the RCA (nondominant), severe bifurcation LAD diagonal lesion 80 and 60% --> sent for CABG x4 No anginal symptoms since his CABG. That was his presenting symptom that led to CABG. Therefore with negative Myoview this on every evaluation since his CABG no reason to suspect that he has ischemic related dyspnea. Continues to be a good regimen with ACE inhibitor and beta blocker, along with statin.  Hyperlipidemia with target LDL less than 70 Remains on statin. He is taking half of the 40 mg dose. Also taking fenofibrate. His PCP has been checking his labs.  Essential hypertension Well-controlled borderline low on current medications.  No  changes for now -- he is on Lotrel which makes it hard to switch him from an ACE inhibitor to an ARB. I will defer to his PCP and pulmonologist. If they think an ACE inhibitor his plan overall, I am perfectly fine with switching to ARB.  Chronic atrial fibrillation Stable rate controlled A. Fib on warfarin heart rate is relatively well controlled on his current dose of beta blocker, I wonder if he does have some chronotropic incompetence. Therefore we'll have him hold his daytime dose and just at night. #4 pack. If he does note that her energy level and improved dyspnea, but simply continue with the once daily dosing and probably convert to Toprol  Long term current use of anticoagulant therapy INR to be checked   Emphysema lung Originally started on Spiriva by pulmonary medicine. I don't see that as currently listed on his medication regimen however.    No orders of the defined types were placed in this encounter.   Meds ordered this encounter  Medications  . furosemide (LASIX) 40 MG tablet    Sig: Take 1 tablet (40 mg total) by mouth daily.    Dispense:  90 tablet    Refill:  3  . potassium chloride (K-DUR,KLOR-CON) 10 MEQ tablet    Sig: Take 1 tablet (10 mEq total) by mouth daily.    Dispense:  90 tablet    Refill:  3  . simvastatin (ZOCOR) 40 MG tablet    Sig: Take 1 tablet (40 mg total) by mouth Nightly.    Dispense:  90 tablet    Refill:  3    Place own file    Followup: 3 months  DAVID W. Herbie BaltimoreHARDING, M.D., M.S. Interventional Cardiologist [facility]

## 2014-11-29 NOTE — Assessment & Plan Note (Signed)
Remains on statin. He is taking half of the 40 mg dose. Also taking fenofibrate. His PCP has been checking his labs.

## 2014-11-29 NOTE — Assessment & Plan Note (Signed)
Unfortunately, I didn't get the desired effect from the stress test and that he did not walk on treadmill. Clearly this obstruction was not read. He thought that with his hip bothering him he wasn't available walk. He never mentioned anything to me about that being an issue before I order it. Unfortunately though we don't have his chronotropic response. No ischemia with normal EF is consistent with his 2011 evaluation. Echocardiographic evidence is not suggested restrictive physiology or constrictive peritonitis. I think probably dealing just with baseline COPD and probably some diastolic dysfunction although again echocardiographically not significant  This is is chronic, and I think he will just have to work through it.

## 2014-11-29 NOTE — Assessment & Plan Note (Signed)
No anginal symptoms since his CABG. That was his presenting symptom that led to CABG. Therefore with negative Myoview this on every evaluation since his CABG no reason to suspect that he has ischemic related dyspnea. Continues to be a good regimen with ACE inhibitor and beta blocker, along with statin.

## 2014-11-29 NOTE — Assessment & Plan Note (Signed)
Well-controlled borderline low on current medications.  No changes for now -- he is on Lotrel which makes it hard to switch him from an ACE inhibitor to an ARB. I will defer to his PCP and pulmonologist. If they think an ACE inhibitor his plan overall, I am perfectly fine with switching to ARB.

## 2014-11-29 NOTE — Assessment & Plan Note (Signed)
Stable rate controlled A. Fib on warfarin heart rate is relatively well controlled on his current dose of beta blocker, I wonder if he does have some chronotropic incompetence. Therefore we'll have him hold his daytime dose and just at night. #4 pack. If he does note that her energy level and improved dyspnea, but simply continue with the once daily dosing and probably convert to Toprol

## 2015-06-14 ENCOUNTER — Ambulatory Visit (INDEPENDENT_AMBULATORY_CARE_PROVIDER_SITE_OTHER): Payer: Medicare Other | Admitting: Cardiology

## 2015-06-14 ENCOUNTER — Encounter: Payer: Self-pay | Admitting: Cardiology

## 2015-06-14 VITALS — BP 94/62 | HR 59 | Ht 68.0 in | Wt 162.8 lb

## 2015-06-14 DIAGNOSIS — R0609 Other forms of dyspnea: Secondary | ICD-10-CM

## 2015-06-14 DIAGNOSIS — I2510 Atherosclerotic heart disease of native coronary artery without angina pectoris: Secondary | ICD-10-CM

## 2015-06-14 DIAGNOSIS — E785 Hyperlipidemia, unspecified: Secondary | ICD-10-CM | POA: Diagnosis not present

## 2015-06-14 DIAGNOSIS — I340 Nonrheumatic mitral (valve) insufficiency: Secondary | ICD-10-CM

## 2015-06-14 DIAGNOSIS — Z7901 Long term (current) use of anticoagulants: Secondary | ICD-10-CM

## 2015-06-14 DIAGNOSIS — I482 Chronic atrial fibrillation, unspecified: Secondary | ICD-10-CM

## 2015-06-14 DIAGNOSIS — I10 Essential (primary) hypertension: Secondary | ICD-10-CM

## 2015-06-14 DIAGNOSIS — J439 Emphysema, unspecified: Secondary | ICD-10-CM

## 2015-06-14 NOTE — Progress Notes (Signed)
PCP: Egbert Garibaldi, NP  Clinic Note: Chief Complaint  Patient presents with  . Follow-up    21month exam; no chest pain, shortness of breath-all the time, no edema, no pain in legs, no cramping in legs, no lightheadedness, no dizziness    HPI: Eric Calderon is a 74 y.o. male with a Cardiovascular Problem List below who presents today for six-month followup of his CAD and chronic A. fib with chronic dyspnea.  Chronic dyspnea since CABG.  Saw PCCM.  Last seen in Dec 2015. Lexi Myoview 08/2014  Echo 2014  Past Medical History  Diagnosis Date  . CAD S/P percutaneous coronary angioplasty October 2006    CATH: 80% MID LAD, involving diagonal bifurcation; 99%  AV GROOVE  CIRCUMFLEX --> PCI of circumflex with 3.0 mm 13 and a Cypher DES; planned staged LAD PCI  . CAD, multiple vessel November 2007    LAD lesion persists, 80% pre-D1 with 60% D1 and distal down the one lesion. 70% ISR and circumflex, L. PDA 80%; nondominant RCA - diffuse 80-70%  . 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 Exertional dyspnea Since 2006    a) 8/'11 Myoview: No Ischemic/Infarct; Echo: Nl LV size & fxn, EF 55-60%, mod-Severe LA & RA dilation,mild AoV sclerosis, Mild PHTN;; 4/'14 Echo: LV EF  55-60%, aortic sclerosis, moderate MR, moderate to severe LA and RA dilation, increased RV pressure -mild PHtn; 9/'15: Lexiscan Myovew: No Ischmia or Infarct, Afib - no EF.  . Moderate mitral regurgitation 03/2013    Echo - above  . Diabetes mellitus without complication     2012  . Chronic atrial fibrillation     Rate control with beta blocker, anticoagulated warfarin  . Essential hypertension   . COPD (chronic obstructive pulmonary disease)     Greater than 100 pack year smoking history, quit 2009  . Hyperlipidemia with target LDL less than 70     Prior Cardiac Evaluation and Past Surgical History: Past Surgical History  Procedure Laterality Date  . Cardiac catheterization  2007      CONCLUSION SIGNIFICANT THREE VESSEL CAD  2.NORMAL LEFT VENTRICULAR SYSTOLIC FUNCTION  . Coronary artery bypass graft  2008    x4  . Transthoracic echocardiogram  04/02/2013    EF 55-60%. No regional W. May. Moderate MR, moderate to severely dilated LA. Mild RV pressure increase with moderate RA dilation. Moderate TR.    Interval History: Eric Calderon presents really stable with no new complaints.  He states that all he has a standard short of breath since CABG he is fine since he does not have any angina or any symptoms heart failure symptoms. He remains active and always on the go. He has had a little but of orthostatic dizziness, but no syncope or near syncope.  Cardiovascular ROS: positive for - dyspnea on exertion and occasional orthostatic dizziness negative for - chest pain, edema, irregular heartbeat, loss of consciousness, murmur, orthopnea, palpitations, paroxysmal nocturnal dyspnea, rapid heart rate, shortness of breath or TIA/Amaurosis fugax, syncope/near syncope   ROS: A comprehensive was performed. Review of Systems  Constitutional: Negative for malaise/fatigue.  HENT: Positive for congestion (can get mucous build up after being flat - cough it up).   Respiratory: Positive for shortness of breath (DOE only  -not @ rest).   Cardiovascular: Negative.  Negative for claudication.       Per HPI  Gastrointestinal: Negative for blood in stool and melena.  Genitourinary: Negative  for hematuria.  Musculoskeletal: Negative for joint pain and falls.       Joints stiff from "old age"  Endo/Heme/Allergies: Does not bruise/bleed easily.  All other systems reviewed and are negative.   Current Outpatient Prescriptions on File Prior to Visit  Medication Sig Dispense Refill  . allopurinol (ZYLOPRIM) 100 MG tablet Take 1 tablet by mouth daily.    [name] amLODipine-benazepril (LOTREL) 5-40 MG per capsule Take 1 capsule by mouth daily.    . fenofibrate 160 MG tablet Take 1 tablet by mouth daily.     . furosemide (LASIX) 40 MG tablet Take 1 tablet (40 mg total) by mouth daily. 90 tablet 3  . glipiZIDE (GLUCOTROL XL) 10 MG 24 hr tablet     . indomethacin (INDOCIN SR) 75 MG CR capsule Take 75 mg by mouth 2 (two) times daily as needed.    [name] JANUMET 50-1000 MG per tablet     . LANCETS SUPER THIN 28G MISC     . metoprolol tartrate (LOPRESSOR) 25 MG tablet TAKE 1/2 TABLET BY MOUTH TWICE DAILY 90 tablet 3  . potassium chloride (K-DUR,KLOR-CON) 10 MEQ tablet Take 1 tablet (10 mEq total) by mouth daily. 90 tablet 3  . simvastatin (ZOCOR) 40 MG tablet Take 1 tablet (40 mg total) by mouth Nightly. 90 tablet 3  . warfarin (COUMADIN) 5 MG tablet Per INR     No current facility-administered medications on file prior to visit.    Allergies  Allergen Reactions  . Niaspan [Niacin Er]     History  Substance Use Topics  . Smoking status: Former Smoker -- 2.00 packs/day for 55 years    Quit date: 07/29/2007  . Smokeless tobacco: Never Used  . Alcohol Use: 16.8 oz/week    28 Cans of beer per week   Family History  Problem Relation Age of Onset  . Cancer Father   . Stroke Mother     Wt Readings from Last 3 Encounters:  06/14/15 73.846 kg (162 lb 12.8 oz)  11/29/14 76.658 kg (169 lb)  09/03/14 75.751 kg (167 lb)    PHYSICAL EXAM BP 94/62 mmHg  Pulse 59  Ht 5\' 8"  (1.727 m)  Wt 73.846 kg (162 lb 12.8 oz)  BMI 24.76 kg/m2 General appearance: alert, cooperative, appears stated age, no distress; relatively healthy-appearing. Pleasant mood and affect  Neck: no adenopathy, no carotid bruit and no JVD  Lungs: Distant heart sounds, but mostly CTA B. with mild basal dry crackles, normal percussion bilaterally and non-labored  Heart: RRR, S1, S2 normal, 1/6 c-d, early peaking SEM heard at RUSB radiating to carotids. Cannot hear SEM; No rub, gallop; nondisplaced PMI  Abdomen: soft, non-tender; bowel sounds normal; no masses, no organomegaly;  Extremities: extremities normal, atraumatic, no  cyanosis, or edema  Pulses: 2+ and symmetric;  Neurologic: Mental status: Alert, oriented, thought content appropriate  Cranial nerves: normal (II-XII grossly intact)    Adult ECG Report  Rate: 59 ;  Rhythm: atrial fibrillation and Slow ventricular response. Anterior ST and T-wave changes with T-wave inversions in the setting of LVH voltage.  Otherwise normal axis, intervals and durations.  Narrative Interpretation: stable EKG  Other studies Reviewed: Additional studies/ records that were reviewed today include:  No New studies. Review of the above records demonstrates: No new results. Recent Labs:     PCP due to check in ~2 monts   ASSESSMENT / PLAN: Problem List Items Addressed This Visit    3-vessel CAD - 70% ISR and  circumflex, diffuse 87 the RCA (nondominant), severe bifurcation LAD diagonal lesion 80 and 60% --> sent for CABG x4 - Primary (Chronic)    Still no anginal symptoms status post CABG. Exertional dyspnea was evaluated with a Myoview in September of last year. Did not show signs of ischemia or infarction.  Continued of ACE inhibitor/calcium channel blocker (would defer to pulmonary medicine and they wanted to switch him from ACE inhibitor to ARB), blocker and statin. Not on aspirin or Plavix because he is on warfarin.      Relevant Orders   EKG 12-Lead (Completed)   Chronic atrial fibrillation (Chronic)    Stable coronary control on beta blocker. Don't think that the A. Fib is to blame for his exertional dyspnea. He is anticoagulated with warfarin. He never did hold the evening dose of metoprolol.      Emphysema lung   Essential hypertension (Chronic)    Actually borderline hypotensive today. We'll monitor his pressures and consider reducing Lotrel. Again, we may want to consider switching from ACE inhibitor to ARB.      Exertional dyspnea (Chronic)    Chronic symptom of exertional dyspnea. He clearly has some obstructive disease noted on PFTs although was told  by pulmonary that was not significant. He does not have any reduced EF or suggestion of restrictive physiology on echo.  Basically is not really overly limiting him. Continue to "press on "      Hyperlipidemia with target LDL less than 70 (Chronic)    Taking Actos simvastatin  Along with fenofibrate.labs are due to be checked soon. - I asked that his primary physician please send copies of labs.      Relevant Orders   EKG 12-Lead (Completed)   Long term current use of anticoagulant therapy (Chronic)   Relevant Orders   EKG 12-Lead (Completed)   Moderate mitral regurgitation (Chronic)    Probably reassess with an echocardiogram next year.I did not hear much progression of his murmur today.      Relevant Orders   EKG 12-Lead (Completed)      Current medicines are reviewed at length with the patient today. (+/- concerns) maybe his blood pressure medications too high The following changes have been made:  Orthostatic symptoms get worse, may reduce Lotrel dose   labs/ tests ordered today include:   Orders Placed This Encounter  Procedures  . EKG 12-Lead     Followup: six-month PA/NP, one year with me  Marykay Lex, M.D., M.S. Interventional Cardiologist   Pager # [phone]

## 2015-06-14 NOTE — Patient Instructions (Signed)
NO CHANGE IN MEDICATION   Your physician recommends that you schedule a follow-up appointment in 6 MONTH WITH EXTENDER TO DR HARDING'S GROUP   Your physician wants you to follow-up in 12 MONTHS DR HARDING - 30 MIN APPOINTMENT  You will receive a reminder letter in the mail two months in advance. If you don't receive a letter, please call our office to schedule the follow-up appointment.

## 2015-06-16 NOTE — Assessment & Plan Note (Signed)
Probably reassess with an echocardiogram next year.I did not hear much progression of his murmur today.

## 2015-06-16 NOTE — Assessment & Plan Note (Addendum)
Still no anginal symptoms status post CABG. Exertional dyspnea was evaluated with a Myoview in September of last year. Did not show signs of ischemia or infarction.  Continued of ACE inhibitor/calcium channel blocker (would defer to pulmonary medicine and they wanted to switch him from ACE inhibitor to ARB), blocker and statin. Not on aspirin or Plavix because he is on warfarin.

## 2015-06-16 NOTE — Assessment & Plan Note (Signed)
Taking Actos simvastatin  Along with fenofibrate.labs are due to be checked soon. - I asked that his primary physician please send copies of labs.

## 2015-06-16 NOTE — Assessment & Plan Note (Signed)
Stable coronary control on beta blocker. Don't think that the A. Fib is to blame for his exertional dyspnea. He is anticoagulated with warfarin. He never did hold the evening dose of metoprolol.

## 2015-06-16 NOTE — Assessment & Plan Note (Signed)
Actually borderline hypotensive today. We'll monitor his pressures and consider reducing Lotrel. Again, we may want to consider switching from ACE inhibitor to ARB.

## 2015-06-16 NOTE — Assessment & Plan Note (Signed)
Chronic symptom of exertional dyspnea. He clearly has some obstructive disease noted on PFTs although was told by pulmonary that was not significant. He does not have any reduced EF or suggestion of restrictive physiology on echo.  Basically is not really overly limiting him. Continue to "press on "

## 2015-07-02 ENCOUNTER — Other Ambulatory Visit: Payer: Self-pay | Admitting: Cardiology

## 2015-09-23 ENCOUNTER — Other Ambulatory Visit: Payer: Self-pay | Admitting: Nephrology

## 2015-09-23 DIAGNOSIS — N183 Chronic kidney disease, stage 3 unspecified: Secondary | ICD-10-CM

## 2015-09-28 ENCOUNTER — Ambulatory Visit
Admission: RE | Admit: 2015-09-28 | Discharge: 2015-09-28 | Disposition: A | Discharge status: Home / Self Care | Payer: Medicare Other | Source: Ambulatory Visit | Attending: Nephrology | Admitting: Nephrology

## 2015-09-28 DIAGNOSIS — N183 Chronic kidney disease, stage 3 unspecified: Secondary | ICD-10-CM

## 2016-01-24 ENCOUNTER — Other Ambulatory Visit: Payer: Self-pay | Admitting: Cardiology

## 2016-02-01 ENCOUNTER — Other Ambulatory Visit: Payer: Self-pay | Admitting: Cardiology

## 2016-02-01 NOTE — Telephone Encounter (Signed)
Rx(s) sent to pharmacy electronically.  

## 2016-03-07 ENCOUNTER — Other Ambulatory Visit: Payer: Self-pay | Admitting: Cardiology

## 2016-03-07 NOTE — Telephone Encounter (Signed)
REFILL 

## 2016-04-24 ENCOUNTER — Other Ambulatory Visit: Payer: Self-pay | Admitting: Cardiology

## 2016-04-24 NOTE — Telephone Encounter (Signed)
REFILL 

## 2016-05-01 ENCOUNTER — Other Ambulatory Visit: Payer: Self-pay | Admitting: Cardiology

## 2016-06-14 ENCOUNTER — Encounter: Payer: Self-pay | Admitting: Cardiology

## 2016-06-14 NOTE — Progress Notes (Signed)
PCP: Egbert Garibaldi, NP  Clinic Note: Chief Complaint  Patient presents with  . Follow-up  . Coronary Artery Disease    Status post CABG  . Shortness of Breath    Chronic; also has COPD  . Atrial Fibrillation    Chronic/persistent  . Mitral Regurgitation    Moderate    HPI: Eric Calderon is a 75 y.o. male with a PMH below who presents today for annual follow-up of CAD in chronic A. fib with chronic dyspnea since his CABG. He is also followed by PCCM.[name]  WITTEN CERTAIN was last seen on 06/14/2015. No major change in symptoms. Chronic exertional dyspnea  Recent Hospitalizations: none  Studies Reviewed: none  Interval History: Eric Calderon returns today really pretty stable. He still has his baseline dyspnea with straining.  No rest or exertional chest pain/discomfort.  No PND, orthopnea or significant edema. No rapid irregular heartbeats palpitations. No syncope/near syncope or TIA/amaurosis fugax. DM - now getting under better control b/c A1c was up - he has been adjusting his diet.  Stopped Amlodipine-Benazapril last year b/c hypotension & fatigue - less dizzy, less fatigue. Feeling better overall.  No melena, hematochezia, hematuria, or epstaxis. No claudication.  ROS: A comprehensive was performed. Review of Systems  Constitutional: Negative for malaise/fatigue.  HENT: Negative for nosebleeds.   Respiratory: Negative for cough, shortness of breath and wheezing.   Cardiovascular: Negative for claudication.  Gastrointestinal: Negative for blood in stool and melena.  Genitourinary: Negative for hematuria.  Musculoskeletal: Positive for joint pain (stiff OA related joints).  Neurological: Negative for headaches.  Endo/Heme/Allergies: Bruises/bleeds easily.  All other systems reviewed and are negative.   Past Medical History  Diagnosis Date  . CAD S/P percutaneous coronary angioplasty October 2006    CATH: 80% MID LAD, involving diagonal bifurcation; 99%  AV GROOVE   CIRCUMFLEX --> PCI of circumflex with 3.0 mm 13 and a Cypher DES; planned staged LAD PCI  . CAD, multiple vessel November 2007    LAD lesion persists, 80% pre-D1 with 60% D1 and distal down the one lesion. 70% ISR and circumflex, L. PDA 80%; nondominant RCA - diffuse 80-70%  . 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 Exertional dyspnea Since 2006    a) 8/'11 Myoview: No Ischemic/Infarct; Echo: Nl LV size & fxn, EF 55-60%, mod-Severe LA & RA dilation,mild AoV sclerosis, Mild PHTN;; 4/'14 Echo: LV EF  55-60%, aortic sclerosis, moderate MR, moderate to severe LA and RA dilation, increased RV pressure -mild PHtn; 9/'15: Lexiscan Myovew: No Ischmia or Infarct, Afib - no EF.  . Moderate mitral regurgitation 03/2013    Echo - above  . Diabetes mellitus without complication (HCC)     2012  . Chronic atrial fibrillation (HCC)     Rate control with beta blocker, anticoagulated warfarin. This patients CHA2DS2-VASc Score and unadjusted Ischemic Stroke Rate (% per year) is equal to 3.2 % stroke rate/year from a score of 3  . Essential hypertension   . COPD (chronic obstructive pulmonary disease) (HCC)     Greater than 100 pack year smoking history, quit 2009  . Hyperlipidemia with target LDL less than 70     Past Surgical History  Procedure Laterality Date  . Cardiac catheterization  2007    CONCLUSION SIGNIFICANT THREE VESSEL CAD  2.NORMAL LEFT VENTRICULAR SYSTOLIC FUNCTION  . Coronary artery bypass graft  2008    x4  . Transthoracic echocardiogram  04/02/2013  EF 55-60%. No regional W. May. Moderate MR, moderate to severely dilated LA. Mild RV pressure increase with moderate RA dilation. Moderate TR.  [name]. Nm myoview ltd  September 2015    Non-gated. Low risk. No ischemia or infarct    Prior to Admission medications   Medication Sig Start Date End Date Taking? Authorizing Provider  allopurinol (ZYLOPRIM) 100 MG tablet Take 1 tablet by mouth daily. 07/19/13  Yes  Historical Provider, MD  fenofibrate 160 MG tablet Take 1 tablet by mouth daily. 06/01/13  Yes Historical Provider, MD  furosemide (LASIX) 40 MG tablet Take 1 tablet (40 mg total) by mouth daily. NEED OV. 04/24/16  Yes Marykay Lexavid W Harding, MD  glipiZIDE (GLUCOTROL XL) 10 MG 24 hr tablet  07/30/14  Yes Historical Provider, MD  indomethacin (INDOCIN SR) 75 MG CR capsule Take 75 mg by mouth 2 (two) times daily as needed.   Yes Historical Provider, MD  JANUMET XR [phone] MG TB24 Take 1 tablet by mouth daily. 03/27/16  Yes Historical Provider, MD  LANCETS SUPER THIN 28G MISC  01/26/14  Yes Historical Provider, MD  metoprolol tartrate (LOPRESSOR) 25 MG tablet TAKE 1/2 TABLET BY MOUTH TWICE DAILY 07/04/15  Yes Marykay Lexavid W Harding, MD  potassium chloride (K-DUR,KLOR-CON) 10 MEQ tablet TAKE 1 TABLET BY MOUTH DAILY 05/01/16  Yes Marykay Lexavid W Harding, MD  simvastatin (ZOCOR) 40 MG tablet Take 1 tablet (40 mg total) by mouth at bedtime. NEED OV. 03/07/16  Yes Marykay Lexavid W Harding, MD  TRUETEST TEST test strip Inject 1 strip into the skin daily. Use 1 test strip each to check glucose daily 03/09/15  Yes Historical Provider, MD  warfarin (COUMADIN) 5 MG tablet Per INR 05/16/13  Yes Historical Provider, MD     Allergies  Allergen Reactions  . Niaspan Odette Fraction[Niacin Er]     Social History   Social History  . Marital Status: Single    Spouse Name: N/A  . Number of Children: N/A  . Years of Education: N/A   Social History Main Topics  . Smoking status: Former Smoker -- 2.00 packs/day for 55 years    Quit date: 07/29/2007  . Smokeless tobacco: Never Used  . Alcohol Use: 16.8 oz/week    28 Cans of beer per week  . Drug Use: No  . Sexual Activity: Not Asked   Other Topics Concern  . None   Social History Narrative   He is a divorced father 3, grandfather of 3. He enjoys walking on the treadmill for roughly 30 minutes at a time daily. No symptoms of that. He also while systolic daily. He doesn't drink alcohol and quit smoking years ago.    Family History family history includes Cancer in his father; Stroke in his mother.   Wt Readings from Last 3 Encounters:  06/15/16 165 lb (74.844 kg)  06/14/15 162 lb 12.8 oz (73.846 kg)  11/29/14 169 lb (76.658 kg)    PHYSICAL EXAM BP 110/60 mmHg  Pulse 59  Ht 5\' 9"  (1.753 m)  Wt 165 lb (74.844 kg)  BMI 24.36 kg/m2  SpO2 99% General appearance: alert, cooperative, appears stated age, no distress; relatively healthy-appearing. Pleasant mood and affect  Neck: no adenopathy, no carotid bruit and no JVD  Lungs: Distant heart sounds, but mostly CTA B. with mild basal dry crackles, normal percussion bilaterally and non-labored  Heart: RRR, S1, S2 normal, 1/6 c-d, early peaking SEM heard at RUSB radiating to carotids. Cannot hear SEM; No rub, gallop; nondisplaced PMI  Abdomen: soft, non-tender; bowel sounds normal; no masses, no organomegaly;  Extremities: extremities normal, atraumatic, no cyanosis, or edema  Pulses: 2+ and symmetric;  Neurologic: Mental status: Alert, oriented, thought content appropriate  Cranial nerves: normal (II-XII grossly intact)    Adult ECG Report  Rate: 59 ;  Rhythm: atrial fibrillation and Slow ventricular response. ST and T-wave abnormality (T-wave inversions in anterolateral leads V2 more prominently V3-V6;  otherwise normal axis, intervals and durations   Narrative Interpretation: stable EKG. TWI are not new   Other studies Reviewed: Additional studies/ records that were reviewed today include:  Recent Labs:  Usually followed by PCP. - checked ~1-2 month ago. No results found for: CHOL, HDL, LDLCALC, LDLDIRECT, TRIG, CHOLHDL   ASSESSMENT / PLAN: Problem List Items Addressed This Visit    Moderate mitral regurgitation (Chronic)    Murmur demonstrating that much changed. Potentially we can check and repeat echocardiogram in a year.  year.      Long term current use of anticoagulant therapy (Chronic)   Hyperlipidemia with target LDL  less than 70 (Chronic)   Exertional dyspnea (Chronic)    Chronic symptom. With cardiac pulmonary access testing, this appeared to be maybe potentially effort related. Is preserved EF and no other active heart failure symptoms. Could perhaps simply be a be related to his atrial fibrillation leading to some of his fatigue. Really not much would back down his beta blocker dose. l.      Essential hypertension (Chronic)    Blood pressure was good today off of ACE inhibitor/CCB. Monitor      Chronic atrial fibrillation (HCC) (Chronic)    Pretty much rate controlled on current dose of beta blocker. Rate seems to be well controlled. No significant bradycardia. This patients CHA2DS2-VASc Score and unadjusted Ischemic Stroke Rate (% per year) is equal to 3.2 % stroke rate/year from a score of 3 --> remains anticoagulated on warfarin. Stable       3-vessel CAD - 70% ISR and circumflex, diffuse 87 the RCA (nondominant), severe bifurcation LAD diagonal lesion 80 and 60% --> sent for CABG x4 - Primary (Chronic)    Remains pretty astigmatic with no active anginal symptoms. Nonischemic Myoview in September 2015. He is on low-dose beta blocker as well as statin. Not on any antiplatelet agent because he is on warfarin. Less dizzy without amlodipine/enalapril.         Current medicines are reviewed at length with the patient today. (+/- concerns)- he stopped exforge.    The following changes have been made:    Studies Ordered:   No orders of the defined types were placed in this encounter.      Bryan Lemmaavid Harding, M.D., M.S. Interventional Cardiologist   Pager # [phone] Phone # [phone] [address] [place], [place] [address]

## 2016-06-15 ENCOUNTER — Encounter: Payer: Self-pay | Admitting: Cardiology

## 2016-06-15 ENCOUNTER — Ambulatory Visit (INDEPENDENT_AMBULATORY_CARE_PROVIDER_SITE_OTHER): Payer: Medicare Other | Admitting: Cardiology

## 2016-06-15 VITALS — BP 110/60 | HR 59 | Ht 69.0 in | Wt 165.0 lb

## 2016-06-15 DIAGNOSIS — E785 Hyperlipidemia, unspecified: Secondary | ICD-10-CM

## 2016-06-15 DIAGNOSIS — R0609 Other forms of dyspnea: Secondary | ICD-10-CM

## 2016-06-15 DIAGNOSIS — I482 Chronic atrial fibrillation, unspecified: Secondary | ICD-10-CM

## 2016-06-15 DIAGNOSIS — I340 Nonrheumatic mitral (valve) insufficiency: Secondary | ICD-10-CM

## 2016-06-15 DIAGNOSIS — Z7901 Long term (current) use of anticoagulants: Secondary | ICD-10-CM

## 2016-06-15 DIAGNOSIS — I10 Essential (primary) hypertension: Secondary | ICD-10-CM

## 2016-06-15 DIAGNOSIS — I2510 Atherosclerotic heart disease of native coronary artery without angina pectoris: Secondary | ICD-10-CM

## 2016-06-15 NOTE — Patient Instructions (Signed)
Your physician wants you to follow-up in: 1 year with Dr. Herbie BaltimoreHarding. You will receive a reminder letter in the mail two months in advance. If you don't receive a letter, please call our office to schedule the follow-up appointment.  We are going to try to get lab work from your primary care doctor - Dr. Herbie BaltimoreHarding wants to make sure that your cholesterol and your liver function numbers are being followed.

## 2016-06-16 ENCOUNTER — Encounter: Payer: Self-pay | Admitting: Cardiology

## 2016-06-16 NOTE — Assessment & Plan Note (Signed)
Remains pretty astigmatic with no active anginal symptoms. Nonischemic Myoview in September 2015. He is on low-dose beta blocker as well as statin. Not on any antiplatelet agent because he is on warfarin. Less dizzy without amlodipine/enalapril.

## 2016-06-16 NOTE — Assessment & Plan Note (Signed)
Chronic symptom. With cardiac pulmonary access testing, this appeared to be maybe potentially effort related. Is preserved EF and no other active heart failure symptoms. Could perhaps simply be a be related to his atrial fibrillation leading to some of his fatigue. Really not much would back down his beta blocker dose. l.

## 2016-06-16 NOTE — Assessment & Plan Note (Signed)
Blood pressure was good today off of ACE inhibitor/CCB. Monitor

## 2016-06-16 NOTE — Assessment & Plan Note (Addendum)
Pretty much rate controlled on current dose of beta blocker. Rate seems to be well controlled. No significant bradycardia. This patients CHA2DS2-VASc Score and unadjusted Ischemic Stroke Rate (% per year) is equal to 3.2 % stroke rate/year from a score of 3 --> remains anticoagulated on warfarin. Stable

## 2016-06-16 NOTE — Assessment & Plan Note (Signed)
Murmur demonstrating that much changed. Potentially we can check and repeat echocardiogram in a year.  year.

## 2016-06-29 ENCOUNTER — Other Ambulatory Visit: Payer: Self-pay | Admitting: Cardiology

## 2016-07-21 ENCOUNTER — Other Ambulatory Visit: Payer: Self-pay | Admitting: Cardiology

## 2016-07-23 NOTE — Telephone Encounter (Signed)
REFILL 

## 2016-09-01 ENCOUNTER — Other Ambulatory Visit: Payer: Self-pay | Admitting: Cardiology

## 2016-09-03 NOTE — Telephone Encounter (Signed)
Rx request sent to pharmacy.  

## 2016-11-05 ENCOUNTER — Other Ambulatory Visit: Payer: Self-pay | Admitting: Cardiology

## 2016-11-05 NOTE — Telephone Encounter (Signed)
REFILL 

## 2017-05-28 ENCOUNTER — Ambulatory Visit: Payer: Medicare Other | Admitting: Cardiology

## 2017-05-31 ENCOUNTER — Ambulatory Visit (INDEPENDENT_AMBULATORY_CARE_PROVIDER_SITE_OTHER): Payer: Medicare Other | Admitting: Cardiology

## 2017-05-31 ENCOUNTER — Encounter: Payer: Self-pay | Admitting: Cardiology

## 2017-05-31 VITALS — BP 104/64 | HR 55 | Ht 68.0 in | Wt 162.2 lb

## 2017-05-31 DIAGNOSIS — I25118 Atherosclerotic heart disease of native coronary artery with other forms of angina pectoris: Secondary | ICD-10-CM | POA: Diagnosis not present

## 2017-05-31 DIAGNOSIS — I482 Chronic atrial fibrillation, unspecified: Secondary | ICD-10-CM

## 2017-05-31 DIAGNOSIS — I10 Essential (primary) hypertension: Secondary | ICD-10-CM

## 2017-05-31 DIAGNOSIS — R0609 Other forms of dyspnea: Secondary | ICD-10-CM | POA: Diagnosis not present

## 2017-05-31 DIAGNOSIS — I209 Angina pectoris, unspecified: Secondary | ICD-10-CM | POA: Diagnosis not present

## 2017-05-31 DIAGNOSIS — E785 Hyperlipidemia, unspecified: Secondary | ICD-10-CM

## 2017-05-31 DIAGNOSIS — I340 Nonrheumatic mitral (valve) insufficiency: Secondary | ICD-10-CM

## 2017-05-31 DIAGNOSIS — Z7901 Long term (current) use of anticoagulants: Secondary | ICD-10-CM | POA: Diagnosis not present

## 2017-05-31 MED ORDER — POTASSIUM CHLORIDE CRYS ER 10 MEQ PO TBCR: 10.0000 meq | EXTENDED_RELEASE_TABLET | Freq: Every day | ORAL | 3 refills | Status: DC

## 2017-05-31 MED ORDER — SIMVASTATIN 40 MG PO TABS: ORAL_TABLET | ORAL | 3 refills | Status: AC

## 2017-05-31 MED ORDER — FUROSEMIDE 40 MG PO TABS: ORAL_TABLET | ORAL | 3 refills | Status: DC

## 2017-05-31 MED ORDER — METOPROLOL TARTRATE 25 MG PO TABS: 12.5000 mg | ORAL_TABLET | Freq: Two times a day (BID) | ORAL | 3 refills | Status: DC

## 2017-05-31 NOTE — Patient Instructions (Signed)
NO CHANGE WITH CURRENT MEDICATION   DISCUSS WITH PRIMARY DR SHAW-- ONCE YOU GET CLOSER TO THE END OF COUMADIN (WARFARIN) DOSE. SUGGEST ELIQUIS - CONTACT OFFICE.    SCHEDULE AT [address] IN June 2019 Your physician has requested that you have an echocardiogram. Echocardiography is a painless test that uses sound waves to create images of your heart. It provides your doctor with information about the size and shape of your heart and how well your heart's chambers and valves are working. This procedure takes approximately one hour. There are no restrictions for this procedure.     Your physician wants you to follow-up in 12 MONTHS WITH DR HARDING AFTER ECHO IS COMPLETED.You will receive a reminder letter in the mail two months in advance. If you don't receive a letter, please call our office to schedule the follow-up appointment.   If you need a refill on your cardiac medications before your next appointment, please call your pharmacy.

## 2017-05-31 NOTE — Progress Notes (Signed)
PCP: Eric PandaMillsaps, Kimberly, NP  Clinic Note: Chief Complaint  Patient presents with  . Follow-up    CAD-CABG, A. fib  . Shortness of Breath    Chronic    HPI: Eric Calderon is a 76 y.o. male with a PMH below who presents today for Annual follow-up of CAD-CABG as well as chronic persistent A. fib. He has chronic dyspnea since his CABG, also followed by PCCM. He has chronic dyspnea that is noted ever since his CABG. He has been relatively stable otherwise, however.  Eric Calderon was last seen in June 2017. Was still stable with no major complaints. No cardiac symptoms. His ACE inhibitor-Channel Blocker Was Discontinued in 2016 Due To Hypotension. He Noted Less Fatigue and Dizziness. Overall Feeling Better.  Recent Hospitalizations: None  Studies Personally Reviewed - (if available, images/films reviewed: From Epic Chart or Care Everywhere)  None  Interval History: Eric Calderon presents today again still noting his baseline exertional dyspnea, but that seems to be stable and has been so for many many years. He still does his treadmill exercising, but has not been doing the outdoor walking like he used to ever since his dog died. Not having his daughter to go out for walks is taken away the phone of walking outside. Despite that he is trying to get himself back into the abdomen going outside regardless. A lot of his walking is limited by low back pain and hip pain now as well. He denies any resting or exertional chest tightness or pressure. No PND, orthopnea or edema.  He has no sensation whatsoever being in atrial fibrillation - no rapid or irregular heartbeat sensations. No syncope or near syncope. No palpitations, lightheadedness, dizziness, weakness. No TIA/amaurosis fugax symptoms. No melena, hematochezia, hematuria, or epstaxis. No claudication.  ROS: A comprehensive was performed. Review of Systems  Constitutional: Negative for malaise/fatigue.  HENT: Positive for hearing loss.  Negative for congestion.   Respiratory: Positive for shortness of breath. Negative for cough and wheezing.   Musculoskeletal: Positive for back pain and joint pain (Hips). Negative for falls.  Neurological: Negative for dizziness, focal weakness and loss of consciousness.  Endo/Heme/Allergies: Bruises/bleeds easily.  Psychiatric/Behavioral: Negative for depression and memory loss. The patient is not nervous/anxious and does not have insomnia.   All other systems reviewed and are negative.   I have reviewed and (if needed) personally updated the patient's problem list, medications, allergies, past medical and surgical history, social and family history.   Past Medical History:  Diagnosis Date  . CAD S/P percutaneous coronary angioplasty October 2006   CATH: 80% MID LAD, involving diagonal bifurcation; 99%  AV GROOVE  CIRCUMFLEX --> PCI of circumflex with 3.0 mm 13 and a Cypher DES; planned staged LAD PCI  . CAD, multiple vessel November 2007   LAD lesion persists, 80% pre-D1 with 60% D1 and distal down the one lesion. 70% ISR and circumflex, L. PDA 80%; nondominant RCA - diffuse 80-70%  . Chronic atrial fibrillation (HCC)    Rate control with beta blocker, anticoagulated warfarin. This patients CHA2DS2-VASc Score and unadjusted Ischemic Stroke Rate (% per year) is equal to 3.2 % stroke rate/year from a score of 3  . COPD (chronic obstructive pulmonary disease) (HCC)    Greater than 100 pack year smoking history, quit 2009  . Diabetes mellitus without complication (HCC)    2012  . Essential hypertension   . Exertional dyspnea Since 2006   a) 8/'11 Myoview: No Ischemic/Infarct; Echo: Nl LV size &  fxn, EF 55-60%, mod-Severe LA & RA dilation,mild AoV sclerosis, Mild PHTN;; 4/'14 Echo: LV EF  55-60%, aortic sclerosis, moderate MR, moderate to severe LA and RA dilation, increased RV pressure -mild PHtn; 9/'15: Lexiscan Myovew: No Ischmia or Infarct, Afib - no EF.  [name] Hyperlipidemia with target LDL  less than 70   . Moderate mitral regurgitation 03/2013   Echo - above  . 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 CARDIAC CATHETERIZATION  2007   CONCLUSION SIGNIFICANT THREE VESSEL CAD  2.NORMAL LEFT VENTRICULAR SYSTOLIC FUNCTION  . CORONARY ARTERY BYPASS GRAFT  2008   x4  . NM MYOVIEW LTD  September 2015   Non-gated. Low risk. No ischemia or infarct  . TRANSTHORACIC ECHOCARDIOGRAM  04/02/2013   EF 55-60%. No regional W. May. Moderate MR, moderate to severely dilated LA. Mild RV pressure increase with moderate RA dilation. Moderate TR.    Current Meds  Medication Sig  . allopurinol (ZYLOPRIM) 100 MG tablet Take 1 tablet by mouth daily.  . fenofibrate 160 MG tablet Take 1 tablet by mouth daily.  . furosemide (LASIX) 40 MG tablet TAKE 1 TABLET(40 MG) BY MOUTH DAILY  . glipiZIDE (GLUCOTROL XL) 10 MG 24 hr tablet   . indomethacin (INDOCIN SR) 75 MG CR capsule Take 75 mg by mouth 2 (two) times daily as needed.  [name] JANUMET XR [id number] MG TB24 Take 1 tablet by mouth daily.  [name] LANCETS SUPER THIN 28G MISC   . metoprolol tartrate (LOPRESSOR) 25 MG tablet Take 0.5 tablets (12.5 mg total) by mouth 2 (two) times daily.  . potassium chloride (K-DUR,KLOR-CON) 10 MEQ tablet Take 1 tablet (10 mEq total) by mouth daily.  . simvastatin (ZOCOR) 40 MG tablet TAKE 1 TABLET(40 MG) BY MOUTH AT BEDTIME  . TRUETEST TEST test strip Inject 1 strip into the skin daily. Use 1 test strip each to check glucose daily  . warfarin (COUMADIN) 5 MG tablet Per INR  . [DISCONTINUED] furosemide (LASIX) 40 MG tablet TAKE 1 TABLET(40 MG) BY MOUTH DAILY  . [DISCONTINUED] metoprolol tartrate (LOPRESSOR) 25 MG tablet TAKE 1/2 TABLET BY MOUTH TWICE DAILY  . [DISCONTINUED] potassium chloride (K-DUR,KLOR-CON) 10 MEQ tablet TAKE 1 TABLET BY MOUTH DAILY  . [DISCONTINUED] simvastatin (ZOCOR) 40 MG tablet TAKE 1 TABLET(40 MG) BY MOUTH AT BEDTIME     Allergies  Allergen Reactions  . Niaspan Odette Fraction Er]     Social History   Social History  . Marital status: Single    Spouse name: N/A  . Number of children: N/A  . Years of education: N/A   Social History Main Topics  . Smoking status: Former Smoker    Packs/day: 2.00    Years: 55.00    Quit date: 07/29/2007  . Smokeless tobacco: Never Used  . Alcohol use 16.8 oz/week    28 Cans of beer per week  . Drug use: No  . Sexual activity: Not Asked   Other Topics Concern  . None   Social History Narrative   He is a divorced father 3, grandfather of 3. He enjoys walking on the treadmill for roughly 30 minutes at a time daily. No symptoms of that. He also while systolic daily. He doesn't drink alcohol and quit smoking years ago.    family history includes Cancer in his father; Stroke in his mother.  Wt Readings from Last 3 Encounters:  05/31/17 162 lb  3.2 oz (73.6 kg)  06/15/16 165 lb (74.8 kg)  06/14/15 162 lb 12.8 oz (73.8 kg)    PHYSICAL EXAM BP 104/64   Pulse (!) 55   Ht 5\' 8"  (1.727 m)   Wt 162 lb 3.2 oz (73.6 kg)   BMI 24.66 kg/m   General appearance: alert, cooperative, appears Slightly older than stated age, no distress. Well-nourished, well-groomed HEENT: [place]/AT, EOMI, MMM, anicteric sclera Neck: no adenopathy, no carotid bruit and no JVD Lungs: clear to auscultation bilaterally, normal percussion bilaterally and non-labored Heart: RRR, S1 &S2 normal, no click, rub or gallop; nondisplaced PMI. 1-2/6C-D, early peaking SEM at RUSB -> carotids. Also noted soft HSM at apex. Abdomen: soft, non-tender; bowel sounds normal; no masses,  no organomegaly; no HJR Extremities: extremities normal, atraumatic, no cyanosis, or edema Pulses: 2+ and symmetric;  Skin: mobility and turgor normal or  Neurologic: Mental status: Alert & oriented x 3, thought content appropriate; non-focal exam.  Pleasant mood & affect.    Adult ECG Report  Rate: 55 ;  Rhythm: atrial  fibrillation and Slow ventricular response; Lateral T-wave inversions consistent with possible ischemia. Otherwise normal axis intervals and durations.  Narrative Interpretation: Stable EKG   Other studies Reviewed: Additional studies/ records that were reviewed today include:  Recent Labs:  No results found for: CHOL, HDL, LDLCALC, LDLDIRECT, TRIG, CHOLHDL No results found for: CREATININE, BUN, NA, K, CL, CO2   ASSESSMENT / PLAN: Problem List Items Addressed This Visit    3-vessel CAD - 70% ISR and circumflex, diffuse 87 the RCA (nondominant), severe bifurcation LAD diagonal lesion 80 and 60% --> sent for CABG x4 (Chronic)    Continues to remain asymptomatic without any anginal symptoms. Myoview was in September 2015 - nonischemic. To the extent that his blood pressure will tolerate, he is on a very low dose of Toprol. No longer on ACE inhibitor/ARB. He is on simvastatin. Not on aspirin because of warfarin.      Relevant Medications   furosemide (LASIX) 40 MG tablet   metoprolol tartrate (LOPRESSOR) 25 MG tablet   simvastatin (ZOCOR) 40 MG tablet   Chronic atrial fibrillation (HCC); CHA2DS2-VASc Score - 4 - Primary (Chronic)    Rate controlled on very low-dose beta blocker. At present, no signs or symptoms of bradycardia or tachycardia. Need to watch for bradycardia then tachycardia based on his resting bradycardia. Anticoagulated with warfarin. His warfarin levels are being followed at his PCPs office.       Relevant Medications   furosemide (LASIX) 40 MG tablet   metoprolol tartrate (LOPRESSOR) 25 MG tablet   simvastatin (ZOCOR) 40 MG tablet   Other Relevant Orders   EKG 12-Lead   ECHOCARDIOGRAM COMPLETE   Essential hypertension (Chronic)    Really does not have hypertension now. Borderline hypotension with beta blocker.      Relevant Medications   furosemide (LASIX) 40 MG tablet   metoprolol tartrate (LOPRESSOR) 25 MG tablet   simvastatin (ZOCOR) 40 MG tablet    Exertional dyspnea (Chronic)    Chronic symptom. Very hard to tell what this is because it is submitted has been persistent since his surgery. It doesn't really seem to be limiting him, just as a frustration that he can't do a lot. It may be related to atrial fib however it is hard to blame total A. fib.      Hyperlipidemia with target LDL less than 70 (Chronic)    Monitored by PCP. He is on simvastatin along with fenofibrate.  I don't have recent labs.  Reportedly well controlled. Need to monitor for myalgias with a combination of fenofibrate and simvastatin.      Relevant Medications   furosemide (LASIX) 40 MG tablet   metoprolol tartrate (LOPRESSOR) 25 MG tablet   simvastatin (ZOCOR) 40 MG tablet   Long term current use of anticoagulant therapy (Chronic)    We talked for a few minutes about potentially converting to a DOAC. Initially he was reluctant to make this change, however, he likes the idea of not having to return for frequent follow-up visits to check his labs. He just refilled his warfarin for the next couple months and would like to have a few months of think about it. He will discuss with his monitoring warfarin clinic. He also needs to check to see whether he would be covered for Xarelto versus ELIQUIS. He will think about this, and will let us know. If necessary we can initiate his prescription if he decides to make the switch.      Moderate mitral regurgitation (Chronic)    Not a significant murmur on exam. Infected sounds more like aortic sclerosis then MR. Plan will be to follow-up echo next year prior to follow-up annual visit.      Relevant Medications   furosemide (LASIX) 40 MG tablet   metoprolol tartrate (LOPRESSOR) 25 MG tablet   simvastatin (ZOCOR) 40 MG tablet   Other Relevant Orders   EKG 12-Lead   ECHOCARDIOGRAM COMPLETE     45 min spent with the patient today.  > 50% of the time was spent discussing potential conversion of DOAC as well as routine  counseling.  Current medicines are reviewed at length with the patient today. (+/- concerns) n/a The following changes have been made: none  Patient Instructions  NO CHANGE WITH CURRENT MEDICATION   DISCUSS WITH PRIMARY DR SHAW-- ONCE YOU GET CLOSER TO THE END OF COUMADIN (WARFARIN) DOSE. SUGGEST ELIQUIS - CONTACT OFFICE.    SCHEDULE AT [address] [place] June 2019 Your physician has requested that you have an echocardiogram. Echocardiography is a painless test that uses sound waves to create images of your heart. It provides your doctor with information about the size and shape of your heart and how well your heart's chambers and valves are working. This procedure takes approximately one hour. There are no restrictions for this procedure.     Your physician wants you to follow-up in 12 MONTHS WITH DR HARDING AFTER ECHO IS COMPLETED.You will receive a reminder letter in the mail two months in advance. If you don't receive a letter, please call our office to schedule the follow-up appointment.   If you need a refill on your cardiac medications before your next appointment, please call your pharmacy.    Studies Ordered:   Orders Placed This Encounter  Procedures  . EKG 12-Lead  . ECHOCARDIOGRAM COMPLETE      Bryan Lemma, M.D., M.S. Interventional Cardiologist   Pager # [phone] Phone # [phone] [address] [place], [place] [address]

## 2017-06-02 ENCOUNTER — Encounter: Payer: Self-pay | Admitting: Cardiology

## 2017-06-02 NOTE — Assessment & Plan Note (Signed)
Continues to remain asymptomatic without any anginal symptoms. Myoview was in September 2015 - nonischemic. To the extent that his blood pressure will tolerate, he is on a very low dose of Toprol. No longer on ACE inhibitor/ARB. He is on simvastatin. Not on aspirin because of warfarin.

## 2017-06-02 NOTE — Assessment & Plan Note (Signed)
We talked for a few minutes about potentially converting to a DOAC. Initially he was reluctant to make this change, however, he likes the idea of not having to return for frequent follow-up visits to check his labs. He just refilled his warfarin for the next couple months and would like to have a few months of think about it. He will discuss with his monitoring warfarin clinic. He also needs to check to see whether he would be covered for Xarelto versus ELIQUIS. He will think about this, and will let us know. If necessary we can initiate his prescription if he decides to make the switch.

## 2017-06-02 NOTE — Assessment & Plan Note (Signed)
Chronic symptom. Very hard to tell what this is because it is submitted has been persistent since his surgery. It doesn't really seem to be limiting him, just as a frustration that he can't do a lot. It may be related to atrial fib however it is hard to blame total A. fib.

## 2017-06-02 NOTE — Assessment & Plan Note (Addendum)
Rate controlled on very low-dose beta blocker. At present, no signs or symptoms of bradycardia or tachycardia. Need to watch for bradycardia then tachycardia based on his resting bradycardia. Anticoagulated with warfarin. His warfarin levels are being followed at his PCPs office.

## 2017-06-02 NOTE — Assessment & Plan Note (Signed)
Monitored by PCP. He is on simvastatin along with fenofibrate. I don't have recent labs.  Reportedly well controlled. Need to monitor for myalgias with a combination of fenofibrate and simvastatin.

## 2017-06-02 NOTE — Assessment & Plan Note (Signed)
Really does not have hypertension now. Borderline hypotension with beta blocker.

## 2017-06-02 NOTE — Assessment & Plan Note (Signed)
Not a significant murmur on exam. Infected sounds more like aortic sclerosis then MR. Plan will be to follow-up echo next year prior to follow-up annual visit.

## 2018-05-24 HISTORY — PX: TRANSTHORACIC ECHOCARDIOGRAM: SHX275

## 2018-05-28 ENCOUNTER — Other Ambulatory Visit: Payer: Self-pay

## 2018-05-28 ENCOUNTER — Ambulatory Visit (HOSPITAL_COMMUNITY): Payer: Medicare Other | Attending: Cardiology

## 2018-05-28 DIAGNOSIS — I10 Essential (primary) hypertension: Secondary | ICD-10-CM | POA: Insufficient documentation

## 2018-05-28 DIAGNOSIS — J449 Chronic obstructive pulmonary disease, unspecified: Secondary | ICD-10-CM | POA: Insufficient documentation

## 2018-05-28 DIAGNOSIS — E119 Type 2 diabetes mellitus without complications: Secondary | ICD-10-CM | POA: Diagnosis not present

## 2018-05-28 DIAGNOSIS — R0600 Dyspnea, unspecified: Secondary | ICD-10-CM | POA: Insufficient documentation

## 2018-05-28 DIAGNOSIS — I25118 Atherosclerotic heart disease of native coronary artery with other forms of angina pectoris: Secondary | ICD-10-CM | POA: Diagnosis not present

## 2018-05-28 DIAGNOSIS — I340 Nonrheumatic mitral (valve) insufficiency: Secondary | ICD-10-CM

## 2018-05-28 DIAGNOSIS — I482 Chronic atrial fibrillation, unspecified: Secondary | ICD-10-CM

## 2018-05-28 DIAGNOSIS — I081 Rheumatic disorders of both mitral and tricuspid valves: Secondary | ICD-10-CM | POA: Diagnosis not present

## 2018-05-28 DIAGNOSIS — E785 Hyperlipidemia, unspecified: Secondary | ICD-10-CM | POA: Diagnosis not present

## 2018-06-03 ENCOUNTER — Ambulatory Visit (INDEPENDENT_AMBULATORY_CARE_PROVIDER_SITE_OTHER): Payer: Medicare Other | Admitting: Cardiology

## 2018-06-03 ENCOUNTER — Encounter: Payer: Self-pay | Admitting: Cardiology

## 2018-06-03 VITALS — BP 122/70 | HR 54 | Ht 68.0 in | Wt 156.2 lb

## 2018-06-03 DIAGNOSIS — E785 Hyperlipidemia, unspecified: Secondary | ICD-10-CM | POA: Diagnosis not present

## 2018-06-03 DIAGNOSIS — I482 Chronic atrial fibrillation, unspecified: Secondary | ICD-10-CM

## 2018-06-03 DIAGNOSIS — Z7901 Long term (current) use of anticoagulants: Secondary | ICD-10-CM

## 2018-06-03 DIAGNOSIS — I25119 Atherosclerotic heart disease of native coronary artery with unspecified angina pectoris: Secondary | ICD-10-CM

## 2018-06-03 DIAGNOSIS — R0609 Other forms of dyspnea: Secondary | ICD-10-CM

## 2018-06-03 DIAGNOSIS — I10 Essential (primary) hypertension: Secondary | ICD-10-CM | POA: Diagnosis not present

## 2018-06-03 DIAGNOSIS — I340 Nonrheumatic mitral (valve) insufficiency: Secondary | ICD-10-CM | POA: Diagnosis not present

## 2018-06-03 MED ORDER — FUROSEMIDE 40 MG PO TABS: ORAL_TABLET | ORAL | 3 refills | Status: AC

## 2018-06-03 MED ORDER — POTASSIUM CHLORIDE CRYS ER 10 MEQ PO TBCR: 10.0000 meq | EXTENDED_RELEASE_TABLET | Freq: Every day | ORAL | 3 refills | Status: AC

## 2018-06-03 MED ORDER — METOPROLOL TARTRATE 25 MG PO TABS: 12.5000 mg | ORAL_TABLET | Freq: Two times a day (BID) | ORAL | 3 refills | Status: AC

## 2018-06-03 NOTE — Patient Instructions (Signed)
No change with current medication      Will schedule in may 2020  - [address] Your physician has requested that you have a lexiscan myoview. For further information please visit [email or web address]. Please follow instruction sheet, as given.      Your physician wants you to follow-up in 12 MONTHS WITH DR HARDINGYou will receive a reminder letter in the mail two months in advance. If you don't receive a letter, please call our office to schedule the follow-up appointment.    If you need a refill on your cardiac medications before your next appointment, please call your pharmacy.

## 2018-06-03 NOTE — Progress Notes (Signed)
PCP: Marva Panda, NP  Clinic Note: Chief Complaint  Patient presents with  . Shortness of Breath    states with anything he do   . Follow-up    CAD, A. fib    HPI: Eric Calderon is a 77 y.o. male who presents today for annual f/u . He has a h/o of CAD-CABG x 4 in 2008 & chronic/persistent Afib along with chronic dyspnea (since CABG)   Eric Calderon was last seen here in June 2018 --no complaints besides his baseline dyspnea.  Started getting out of his walking routine because his dog died.  Otherwise no major complaints.    His PCP checks labs and reportedly recheck them in April May timeframe of this year.  Recent Hospitalizations: n/a  Studies Personally Reviewed - (if available, images/films reviewed: From Epic Chart or Care Everywhere)  2D Echo May 28, 2018: Normal LV size.  Moderate LVH.  EF 55-60% with no regional wall motion abnormality.  Unable to assess diastolic function.  Severe biatrial enlargement.  Mild MR  Interval History: Eric Calderon returns today for routine follow-up with no major complaints.  He really has no concept whatsoever whether he is or is not in atrial fibrillation.  He only notes his heart rate going fast if he is actually exerting himself.  But otherwise has no sensation of irregular heartbeat or palpitations. He has no PND orthopnea and edema is relatively well-controlled with his current dose of Lasix.  He has not had to take any additional doses now. He got a habit/got lackadaisical about his walking routine ever since his dog died has not yet gotten back into his walking  He did just get a brand-new dog and is looking forward to taking the dog walking is getting better. Despite his chronic shortness of breath sensations, he really has no resting or exertional chest pain or pressure.  No palpitations, lightheadedness, dizziness, weakness or syncope/near syncope. No TIA/amaurosis fugax symptoms.  No claudication.  ROS: A comprehensive was  performed. Review of Systems  Constitutional: Positive for weight loss (Adjusting diet). Negative for malaise/fatigue.  HENT: Negative for congestion and nosebleeds.   Respiratory: Positive for shortness of breath (Still notes his chronic shortness of breath that been ongoing since his bypass surgery.  No change).   Cardiovascular: Positive for leg swelling (Well-controlled with current dose of Lasix).  Gastrointestinal: Negative for abdominal pain, blood in stool, heartburn and melena.  Genitourinary: Negative for hematuria.  Musculoskeletal: Positive for back pain and joint pain (Hips -this usually stops his walking).  Neurological: Negative for dizziness, tingling and headaches.  Endo/Heme/Allergies: Bruises/bleeds easily.  Psychiatric/Behavioral: Negative for depression and memory loss. The patient is not nervous/anxious.   All other systems reviewed and are negative.  I have reviewed and (if needed) personally updated the patient's problem list, medications, allergies, past medical and surgical history, social and family history.   Past Medical History:  Diagnosis Date  . CAD S/P percutaneous coronary angioplasty October 2006   CATH: 80% MID LAD, involving diagonal bifurcation; 99%  AV GROOVE  CIRCUMFLEX --> PCI of circumflex with 3.0 mm 13 and a Cypher DES; planned staged LAD PCI  . CAD, multiple vessel November 2007   LAD lesion persists, 80% pre-D1 with 60% D1 and distal down the one lesion. 70% ISR and circumflex, L. PDA 80%; nondominant RCA - diffuse 80-70%  . Chronic atrial fibrillation (HCC)    Rate control with beta blocker, anticoagulated warfarin. This patients CHA2DS2-VASc Score and unadjusted  Ischemic Stroke Rate (% per year) is equal to 3.2 % stroke rate/year from a score of 3  . COPD (chronic obstructive pulmonary disease) (HCC)    Greater than 100 pack year smoking history, quit 2009  . Diabetes mellitus without complication (HCC)    2012  . Essential hypertension   .  Exertional dyspnea Since 2006   a) 8/'11 Myoview: No Ischemic/Infarct; Echo: Nl LV size & fxn, EF 55-60%, mod-Severe LA & RA dilation,mild AoV sclerosis, Mild PHTN;; 4/'14 Echo: LV EF  55-60%, aortic sclerosis, moderate MR, moderate to severe LA and RA dilation, increased RV pressure -mild PHtn; 9/'15: Lexiscan Myovew: No Ischmia or Infarct, Afib - no EF.  [name] Hyperlipidemia with target LDL less than 70   . Mild mitral regurgitation by prior echocardiography 03/2013   Echo 2019:  Mild MR  . 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 CARDIAC CATHETERIZATION  2007   CONCLUSION SIGNIFICANT THREE VESSEL CAD  2.NORMAL LEFT VENTRICULAR SYSTOLIC FUNCTION  . CORONARY ARTERY BYPASS GRAFT  2008   x4  . NM MYOVIEW LTD  September 2015   Non-gated. Low risk. No ischemia or infarct  . TRANSTHORACIC ECHOCARDIOGRAM  04/02/2013   EF 55-60%. No regional W. May. Moderate MR, moderate to severely dilated LA. Mild RV pressure increase with moderate RA dilation. Moderate TR.  [name] TRANSTHORACIC ECHOCARDIOGRAM  05/2018   Normal LV size.  Moderate LVH.  EF 55-60% with no regional wall motion abnormality.  Unable to assess diastolic function.  Severe biatrial enlargement.  Mild MR    Current Meds  Medication Sig  . allopurinol (ZYLOPRIM) 100 MG tablet Take 1 tablet by mouth daily.  . fenofibrate 160 MG tablet Take 1 tablet by mouth daily.  [name] glipiZIDE (GLUCOTROL XL) 10 MG 24 hr tablet   . indomethacin (INDOCIN SR) 75 MG CR capsule Take 75 mg by mouth 2 (two) times daily as needed.  [name] JANUMET XR [id number] MG TB24 Take 1 tablet by mouth daily.  [name] LANCETS SUPER THIN 28G MISC   . simvastatin (ZOCOR) 40 MG tablet TAKE 1 TABLET(40 MG) BY MOUTH AT BEDTIME  . TRUETEST TEST test strip Inject 1 strip into the skin daily. Use 1 test strip each to check glucose daily  . warfarin (COUMADIN) 5 MG tablet Per INR  . [DISCONTINUED] furosemide  (LASIX) 40 MG tablet TAKE 1 TABLET(40 MG) BY MOUTH DAILY  . [DISCONTINUED] metoprolol tartrate (LOPRESSOR) 25 MG tablet Take 0.5 tablets (12.5 mg total) by mouth 2 (two) times daily.  . [DISCONTINUED] potassium chloride (K-DUR,KLOR-CON) 10 MEQ tablet Take 1 tablet (10 mEq total) by mouth daily.    Allergies  Allergen Reactions  . Niaspan Odette Fraction Er]     Social History   Tobacco Use  . Smoking status: Former Smoker    Packs/day: 2.00    Years: 55.00    Pack years: 110.00    Last attempt to quit: 07/29/2007    Years since quitting: 10.8  . Smokeless tobacco: Never Used  Substance Use Topics  . Alcohol use: Yes    Alcohol/week: 16.8 oz    Types: 28 Cans of beer per week  . Drug use: No   Social History   Social History Narrative   He is a divorced father 3, grandfather of 3. He enjoys walking on the treadmill for roughly 30 minutes at a time daily. No symptoms of  that. He also while systolic daily. He doesn't drink alcohol and quit smoking years ago.    family history includes Cancer in his father; Stroke in his mother.  Wt Readings from Last 3 Encounters:  06/03/18 156 lb 3.2 oz (70.9 kg)  05/31/17 162 lb 3.2 oz (73.6 kg)  06/15/16 165 lb (74.8 kg)    PHYSICAL EXAM BP 122/70   Pulse (!) 54   Ht 5\' 8"  (1.727 m)   Wt 156 lb 3.2 oz (70.9 kg)   BMI 23.75 kg/m  Physical Exam  Constitutional: He is oriented to person, place, and time. He appears well-developed and well-nourished. No distress.  Healthy-appearing.  Well-groomed.  HENT:  Head: Normocephalic and atraumatic.  Neck: No hepatojugular reflux and no JVD present. Carotid bruit is not present (radiated AoV murmur).  Cardiovascular: Intact distal pulses and normal pulses. An irregularly irregular rhythm present. Bradycardia present. PMI is not displaced. Exam reveals no gallop and no friction rub.  Murmur heard.  Low-pitched blowing holosystolic murmur is present with a grade of 1/6 at the apex. Pulmonary/Chest:  Breath sounds normal. No respiratory distress. He has no wheezes. He has no rales.  Abdominal: Soft. Bowel sounds are normal. He exhibits no distension. There is no tenderness. There is no rebound.  No HSM  Musculoskeletal: Normal range of motion. He exhibits edema (Trivial edema with mild stasis changes).  Neurological: He is alert and oriented to person, place, and time.  Psychiatric: He has a normal mood and affect. His behavior is normal. Judgment and thought content normal.  Vitals reviewed.     Adult ECG Report  Rate: 54 ;  Rhythm: atrial fibrillation and Slow ventricular response with PVC versus aberrantly conducted beat.;  Lateral ST and T wave inversions cannot exclude ischemia versus LVH.    Narrative Interpretation: No notable change.   Other studies Reviewed: Additional studies/ records that were reviewed today include:  Recent Labs:  None available    ASSESSMENT / PLAN: Problem List Items Addressed This Visit    Moderate mitral regurgitation (Chronic)    Now that his blood pressures and edema are better controlled it would appear that some of the MR was functional and is now read as mild not moderate. We will recheck echo with MR murmur sounds louder.      Relevant Medications   furosemide (LASIX) 40 MG tablet   metoprolol tartrate (LOPRESSOR) 25 MG tablet   Other Relevant Orders   EKG 12-Lead (Completed)   MYOCARDIAL PERFUSION IMAGING   Long term current use of anticoagulant therapy (Chronic)    At this point he prefers to continue warfarin with his INR checks being on his PCPs office. He is concerned about the financial ramifications of converting to a DOAC.      Hyperlipidemia with target LDL less than 70 (Chronic)    Last labs I had seen her back in 2013 and he was well-controlled.  His PCP recently check labs and await to see what the results are get sent to [place].  He is on combination of simvastatin and fenofibrate so we need to carefully monitor his liver  function test.      Relevant Medications   furosemide (LASIX) 40 MG tablet   metoprolol tartrate (LOPRESSOR) 25 MG tablet   Exertional dyspnea (Chronic)   Relevant Orders   EKG 12-Lead (Completed)   MYOCARDIAL PERFUSION IMAGING   Essential hypertension (Chronic)    Well-controlled with low-dose beta-blocker alone.  Not really significant diagnosis  Relevant Medications   furosemide (LASIX) 40 MG tablet   metoprolol tartrate (LOPRESSOR) 25 MG tablet   Chronic atrial fibrillation (HCC); CHA2DS2-VASc Score - 4 (Chronic)    Asymptomatic rate controlled A. fib with no sensation.  Borderline bradycardia, no symptoms of bradycardia.  No chronotropic competence sensation.   He switch to getting his warfarin checks to his PCPs office.  He is also declined going to DO[facility] because of financial ramifications.      Relevant Medications   furosemide (LASIX) 40 MG tablet   metoprolol tartrate (LOPRESSOR) 25 MG tablet   Other Relevant Orders   EKG 12-Lead (Completed)   MYOCARDIAL PERFUSION IMAGING   3-vessel CAD - 70% ISR and circumflex, diffuse 87 the RCA (nondominant), severe bifurcation LAD diagonal lesion 80 and 60% --> sent for CABG x4 - Primary (Chronic)    Doing well without any anginal symptoms.  He is on a beta-blocker and statin. Not on aspirin/Plavix because of warfarin. Doing well without ACE inhibitor/ARB, continue to monitor.      Relevant Medications   furosemide (LASIX) 40 MG tablet   metoprolol tartrate (LOPRESSOR) 25 MG tablet   Other Relevant Orders   MYOCARDIAL PERFUSION IMAGING       I spent a total of 25minutes with the patient and chart review. >  50% of the time was spent in direct patient consultation.   Current medicines are reviewed at length with the patient today.  (+/- concerns) n/a The following changes have been made:  n/a  Patient Instructions  No change with current medication      Will schedule in may 2020  - [address] Your physician has requested that you have a lexiscan myoview. For further information please visit [email or web address]. Please follow instruction sheet, as given.      Your physician wants you to follow-up in 12 MONTHS WITH DR HARDINGYou will receive a reminder letter in the mail two months in advance. If you don't receive a letter, please call our office to schedule the follow-up appointment.    If you need a refill on your cardiac medications before your next appointment, please call your pharmacy.     Studies Ordered:   Orders Placed This Encounter  Procedures  . MYOCARDIAL PERFUSION IMAGING  . EKG 12-Lead      Bryan Lemmaavid Harding, M.D., M.S. Interventional Cardiologist   Pager # [phone] Phone # [phone] [address] [place], [place] [address]   Thank you for choosing [facility]!!

## 2018-06-04 ENCOUNTER — Telehealth: Payer: Self-pay | Admitting: *Deleted

## 2018-06-04 NOTE — Telephone Encounter (Signed)
Left message to send copy of lastest lipid and cmp panel

## 2018-06-05 ENCOUNTER — Encounter: Payer: Self-pay | Admitting: Cardiology

## 2018-06-05 NOTE — Assessment & Plan Note (Signed)
Asymptomatic rate controlled A. fib with no sensation.  Borderline bradycardia, no symptoms of bradycardia.  No chronotropic competence sensation.   He switch to getting his warfarin checks to his PCPs office.  He is also declined going to [facility] because of financial ramifications.

## 2018-06-05 NOTE — Assessment & Plan Note (Signed)
Now that his blood pressures and edema are better controlled it would appear that some of the MR was functional and is now read as mild not moderate. We will recheck echo with MR murmur sounds louder.

## 2018-06-05 NOTE — Assessment & Plan Note (Signed)
Doing well without any anginal symptoms.  He is on a beta-blocker and statin. Not on aspirin/Plavix because of warfarin. Doing well without ACE inhibitor/ARB, continue to monitor.

## 2018-06-05 NOTE — Assessment & Plan Note (Signed)
At this point he prefers to continue warfarin with his INR checks being on his PCPs office. He is concerned about the financial ramifications of converting to a DOAC.

## 2018-06-05 NOTE — Assessment & Plan Note (Signed)
Last labs I had seen her back in 2013 and he was well-controlled.  His PCP recently check labs and await to see what the results are get sent to us.  He is on combination of simvastatin and fenofibrate so we need to carefully monitor his liver function test.

## 2018-06-05 NOTE — Assessment & Plan Note (Signed)
Well-controlled with low-dose beta-blocker alone.  Not really significant diagnosis

## 2018-06-06 NOTE — Telephone Encounter (Signed)
[facility]

## 2018-07-08 ENCOUNTER — Other Ambulatory Visit: Payer: Self-pay

## 2018-07-08 ENCOUNTER — Emergency Department (HOSPITAL_COMMUNITY): Payer: Medicare Other

## 2018-07-08 ENCOUNTER — Encounter (HOSPITAL_COMMUNITY): Payer: Self-pay

## 2018-07-08 ENCOUNTER — Inpatient Hospital Stay (HOSPITAL_COMMUNITY)
Admission: EM | Admit: 2018-07-08 | Discharge: 2018-07-12 | DRG: 812 | Disposition: A | Discharge status: Skilled Nursing Facility | Payer: Medicare Other | Attending: Family Medicine | Admitting: Family Medicine

## 2018-07-08 DIAGNOSIS — Z6821 Body mass index (BMI) 21.0-21.9, adult: Secondary | ICD-10-CM

## 2018-07-08 DIAGNOSIS — D62 Acute posthemorrhagic anemia: Principal | ICD-10-CM | POA: Diagnosis present

## 2018-07-08 DIAGNOSIS — S2242XA Multiple fractures of ribs, left side, initial encounter for closed fracture: Secondary | ICD-10-CM | POA: Diagnosis present

## 2018-07-08 DIAGNOSIS — R791 Abnormal coagulation profile: Secondary | ICD-10-CM | POA: Diagnosis present

## 2018-07-08 DIAGNOSIS — Z87891 Personal history of nicotine dependence: Secondary | ICD-10-CM | POA: Diagnosis not present

## 2018-07-08 DIAGNOSIS — Z66 Do not resuscitate: Secondary | ICD-10-CM | POA: Diagnosis present

## 2018-07-08 DIAGNOSIS — T45515A Adverse effect of anticoagulants, initial encounter: Secondary | ICD-10-CM | POA: Diagnosis present

## 2018-07-08 DIAGNOSIS — I4891 Unspecified atrial fibrillation: Secondary | ICD-10-CM | POA: Diagnosis not present

## 2018-07-08 DIAGNOSIS — N179 Acute kidney failure, unspecified: Secondary | ICD-10-CM | POA: Diagnosis present

## 2018-07-08 DIAGNOSIS — K449 Diaphragmatic hernia without obstruction or gangrene: Secondary | ICD-10-CM | POA: Diagnosis not present

## 2018-07-08 DIAGNOSIS — Z7901 Long term (current) use of anticoagulants: Secondary | ICD-10-CM

## 2018-07-08 DIAGNOSIS — S2239XA Fracture of one rib, unspecified side, initial encounter for closed fracture: Secondary | ICD-10-CM | POA: Diagnosis present

## 2018-07-08 DIAGNOSIS — D6832 Hemorrhagic disorder due to extrinsic circulating anticoagulants: Secondary | ICD-10-CM | POA: Diagnosis present

## 2018-07-08 DIAGNOSIS — D72829 Elevated white blood cell count, unspecified: Secondary | ICD-10-CM | POA: Diagnosis not present

## 2018-07-08 DIAGNOSIS — Z7984 Long term (current) use of oral hypoglycemic drugs: Secondary | ICD-10-CM

## 2018-07-08 DIAGNOSIS — K922 Gastrointestinal hemorrhage, unspecified: Secondary | ICD-10-CM | POA: Diagnosis not present

## 2018-07-08 DIAGNOSIS — E860 Dehydration: Secondary | ICD-10-CM | POA: Diagnosis present

## 2018-07-08 DIAGNOSIS — K2270 Barrett's esophagus without dysplasia: Secondary | ICD-10-CM | POA: Diagnosis not present

## 2018-07-08 DIAGNOSIS — R296 Repeated falls: Secondary | ICD-10-CM | POA: Diagnosis not present

## 2018-07-08 DIAGNOSIS — R0902 Hypoxemia: Secondary | ICD-10-CM | POA: Diagnosis not present

## 2018-07-08 DIAGNOSIS — I482 Chronic atrial fibrillation, unspecified: Secondary | ICD-10-CM | POA: Diagnosis present

## 2018-07-08 DIAGNOSIS — E119 Type 2 diabetes mellitus without complications: Secondary | ICD-10-CM

## 2018-07-08 DIAGNOSIS — N183 Chronic kidney disease, stage 3 (moderate): Secondary | ICD-10-CM | POA: Diagnosis present

## 2018-07-08 DIAGNOSIS — Z23 Encounter for immunization: Secondary | ICD-10-CM

## 2018-07-08 DIAGNOSIS — Z888 Allergy status to other drugs, medicaments and biological substances status: Secondary | ICD-10-CM

## 2018-07-08 DIAGNOSIS — I959 Hypotension, unspecified: Secondary | ICD-10-CM | POA: Diagnosis not present

## 2018-07-08 DIAGNOSIS — N19 Unspecified kidney failure: Secondary | ICD-10-CM

## 2018-07-08 DIAGNOSIS — Z79899 Other long term (current) drug therapy: Secondary | ICD-10-CM

## 2018-07-08 DIAGNOSIS — K921 Melena: Secondary | ICD-10-CM | POA: Diagnosis present

## 2018-07-08 DIAGNOSIS — D649 Anemia, unspecified: Secondary | ICD-10-CM | POA: Diagnosis not present

## 2018-07-08 DIAGNOSIS — I10 Essential (primary) hypertension: Secondary | ICD-10-CM | POA: Diagnosis not present

## 2018-07-08 DIAGNOSIS — E440 Moderate protein-calorie malnutrition: Secondary | ICD-10-CM

## 2018-07-08 DIAGNOSIS — I248 Other forms of acute ischemic heart disease: Secondary | ICD-10-CM | POA: Diagnosis present

## 2018-07-08 DIAGNOSIS — M109 Gout, unspecified: Secondary | ICD-10-CM | POA: Diagnosis present

## 2018-07-08 DIAGNOSIS — E876 Hypokalemia: Secondary | ICD-10-CM | POA: Diagnosis present

## 2018-07-08 DIAGNOSIS — I2510 Atherosclerotic heart disease of native coronary artery without angina pectoris: Secondary | ICD-10-CM | POA: Diagnosis present

## 2018-07-08 DIAGNOSIS — E785 Hyperlipidemia, unspecified: Secondary | ICD-10-CM | POA: Diagnosis present

## 2018-07-08 DIAGNOSIS — E1151 Type 2 diabetes mellitus with diabetic peripheral angiopathy without gangrene: Secondary | ICD-10-CM

## 2018-07-08 DIAGNOSIS — R7989 Other specified abnormal findings of blood chemistry: Secondary | ICD-10-CM | POA: Diagnosis present

## 2018-07-08 DIAGNOSIS — E1122 Type 2 diabetes mellitus with diabetic chronic kidney disease: Secondary | ICD-10-CM | POA: Diagnosis not present

## 2018-07-08 DIAGNOSIS — I25119 Atherosclerotic heart disease of native coronary artery with unspecified angina pectoris: Secondary | ICD-10-CM | POA: Diagnosis not present

## 2018-07-08 DIAGNOSIS — R778 Other specified abnormalities of plasma proteins: Secondary | ICD-10-CM | POA: Diagnosis present

## 2018-07-08 DIAGNOSIS — J449 Chronic obstructive pulmonary disease, unspecified: Secondary | ICD-10-CM | POA: Diagnosis present

## 2018-07-08 DIAGNOSIS — E872 Acidosis, unspecified: Secondary | ICD-10-CM | POA: Diagnosis present

## 2018-07-08 DIAGNOSIS — I129 Hypertensive chronic kidney disease with stage 1 through stage 4 chronic kidney disease, or unspecified chronic kidney disease: Secondary | ICD-10-CM | POA: Diagnosis present

## 2018-07-08 DIAGNOSIS — R748 Abnormal levels of other serum enzymes: Secondary | ICD-10-CM | POA: Diagnosis not present

## 2018-07-08 DIAGNOSIS — Z951 Presence of aortocoronary bypass graft: Secondary | ICD-10-CM

## 2018-07-08 HISTORY — DX: Gastrointestinal hemorrhage, unspecified: K92.2

## 2018-07-08 HISTORY — DX: Fracture of one rib, unspecified side, initial encounter for closed fracture: S22.39XA

## 2018-07-08 HISTORY — DX: Unspecified atrial fibrillation: I48.91

## 2018-07-08 HISTORY — DX: Anemia, unspecified: D64.9

## 2018-07-08 HISTORY — DX: Abnormal coagulation profile: R79.1

## 2018-07-08 LAB — I-STAT VENOUS BLOOD GAS, ED
Acid-base deficit: 7 mmol/L — ABNORMAL HIGH (ref 0.0–2.0)
BICARBONATE: 17.4 mmol/L — AB (ref 20.0–28.0)
O2 Saturation: 26 %
TCO2: 18 mmol/L — AB (ref 22–32)
pCO2, Ven: 29.5 mmHg — ABNORMAL LOW (ref 44.0–60.0)
pH, Ven: 7.379 (ref 7.250–7.430)
pO2, Ven: 18 mmHg — CL (ref 32.0–45.0)

## 2018-07-08 LAB — BASIC METABOLIC PANEL
ANION GAP: 23 — AB (ref 5–15)
Anion gap: 15 (ref 5–15)
Anion gap: 12 (ref 5–15)
Anion gap: 8 (ref 5–15)
BUN: 101 mg/dL — ABNORMAL HIGH (ref 8–23)
BUN: 99 mg/dL — ABNORMAL HIGH (ref 8–23)
BUN: 96 mg/dL — ABNORMAL HIGH (ref 8–23)
BUN: 83 mg/dL — ABNORMAL HIGH (ref 8–23)
CALCIUM: 8.3 mg/dL — AB (ref 8.9–10.3)
CHLORIDE: 99 mmol/L (ref 98–111)
CHLORIDE: 102 mmol/L (ref 98–111)
CHLORIDE: 103 mmol/L (ref 98–111)
CO2: 12 mmol/L — ABNORMAL LOW (ref 22–32)
CO2: 18 mmol/L — AB (ref 22–32)
CO2: 21 mmol/L — ABNORMAL LOW (ref 22–32)
CO2: 25 mmol/L (ref 22–32)
CREATININE: 2.62 mg/dL — AB (ref 0.61–1.24)
Calcium: 7.9 mg/dL — ABNORMAL LOW (ref 8.9–10.3)
Calcium: 8.1 mg/dL — ABNORMAL LOW (ref 8.9–10.3)
Calcium: 8.2 mg/dL — ABNORMAL LOW (ref 8.9–10.3)
Chloride: 96 mmol/L — ABNORMAL LOW (ref 98–111)
Creatinine, Ser: 3.02 mg/dL — ABNORMAL HIGH (ref 0.61–1.24)
Creatinine, Ser: 2.18 mg/dL — ABNORMAL HIGH (ref 0.61–1.24)
Creatinine, Ser: 1.86 mg/dL — ABNORMAL HIGH (ref 0.61–1.24)
GFR calc Af Amer: 22 mL/min — ABNORMAL LOW (ref >60)
GFR calc Af Amer: 26 mL/min — ABNORMAL LOW (ref >60)
GFR calc non Af Amer: 19 mL/min — ABNORMAL LOW (ref >60)
GFR calc non Af Amer: 22 mL/min — ABNORMAL LOW (ref >60)
GFR calc non Af Amer: 28 mL/min — ABNORMAL LOW (ref >60)
GFR, EST AFRICAN AMERICAN: 32 mL/min — AB (ref >60)
GFR, EST AFRICAN AMERICAN: 39 mL/min — AB (ref >60)
GFR, EST NON AFRICAN AMERICAN: 34 mL/min — AB (ref >60)
Glucose, Bld: 297 mg/dL — ABNORMAL HIGH (ref 70–99)
Glucose, Bld: 263 mg/dL — ABNORMAL HIGH (ref 70–99)
Glucose, Bld: 235 mg/dL — ABNORMAL HIGH (ref 70–99)
Glucose, Bld: 107 mg/dL — ABNORMAL HIGH (ref 70–99)
POTASSIUM: 3.8 mmol/L (ref 3.5–5.1)
POTASSIUM: 3.2 mmol/L — AB (ref 3.5–5.1)
POTASSIUM: 3.2 mmol/L — AB (ref 3.5–5.1)
Potassium: 4 mmol/L (ref 3.5–5.1)
SODIUM: 132 mmol/L — AB (ref 135–145)
SODIUM: 135 mmol/L (ref 135–145)
SODIUM: 136 mmol/L (ref 135–145)
Sodium: 131 mmol/L — ABNORMAL LOW (ref 135–145)

## 2018-07-08 LAB — URINALYSIS, ROUTINE W REFLEX MICROSCOPIC
BILIRUBIN URINE: NEGATIVE
Glucose, UA: >=500 mg/dL — AB
KETONES UR: NEGATIVE mg/dL
NITRITE: NEGATIVE
Protein, ur: NEGATIVE mg/dL
Specific Gravity, Urine: 1.009 (ref 1.005–1.030)
pH: 5 (ref 5.0–8.0)

## 2018-07-08 LAB — I-STAT CG4 LACTIC ACID, ED: Lactic Acid, Venous: 4.98 mmol/L (ref 0.5–1.9)

## 2018-07-08 LAB — CBC
HCT: 25 % — ABNORMAL LOW (ref 39.0–52.0)
HEMATOCRIT: 25.6 % — AB (ref 39.0–52.0)
HEMOGLOBIN: 7.9 g/dL — AB (ref 13.0–17.0)
Hemoglobin: 8.8 g/dL — ABNORMAL LOW (ref 13.0–17.0)
MCH: 29.3 pg (ref 26.0–34.0)
MCH: 29.7 pg (ref 26.0–34.0)
MCHC: 31.6 g/dL (ref 30.0–36.0)
MCHC: 34.4 g/dL (ref 30.0–36.0)
MCV: 92.6 fL (ref 78.0–100.0)
MCV: 86.5 fL (ref 78.0–100.0)
PLATELETS: 448 10*3/uL — AB (ref 150–400)
Platelets: 334 10*3/uL (ref 150–400)
RBC: 2.7 MIL/uL — AB (ref 4.22–5.81)
RBC: 2.96 MIL/uL — ABNORMAL LOW (ref 4.22–5.81)
RDW: 14.6 % (ref 11.5–15.5)
RDW: 13.8 % (ref 11.5–15.5)
WBC: 26 10*3/uL — ABNORMAL HIGH (ref 4.0–10.5)
WBC: 19.4 10*3/uL — ABNORMAL HIGH (ref 4.0–10.5)

## 2018-07-08 LAB — PROTIME-INR
INR: 1.52
PROTHROMBIN TIME: 18.2 s — AB (ref 11.4–15.2)

## 2018-07-08 LAB — PREPARE RBC (CROSSMATCH)

## 2018-07-08 LAB — HEPATIC FUNCTION PANEL
ALBUMIN: 1.9 g/dL — AB (ref 3.5–5.0)
ALK PHOS: 36 U/L — AB (ref 38–126)
ALT: 20 U/L (ref 0–44)
AST: 50 U/L — AB (ref 15–41)
BILIRUBIN TOTAL: 1.2 mg/dL (ref 0.3–1.2)
Bilirubin, Direct: 0.6 mg/dL — ABNORMAL HIGH (ref 0.0–0.2)
Indirect Bilirubin: 0.6 mg/dL (ref 0.3–0.9)
Total Protein: 4.7 g/dL — ABNORMAL LOW (ref 6.5–8.1)

## 2018-07-08 LAB — HEMOGLOBIN A1C
HEMOGLOBIN A1C: 7.4 % — AB (ref 4.8–5.6)
Mean Plasma Glucose: 165.68 mg/dL

## 2018-07-08 LAB — LACTIC ACID, PLASMA: Lactic Acid, Venous: 1.6 mmol/L (ref 0.5–1.9)

## 2018-07-08 LAB — GLUCOSE, CAPILLARY
GLUCOSE-CAPILLARY: 161 mg/dL — AB (ref 70–99)
Glucose-Capillary: 154 mg/dL — ABNORMAL HIGH (ref 70–99)
Glucose-Capillary: 62 mg/dL — ABNORMAL LOW (ref 70–99)

## 2018-07-08 LAB — CBG MONITORING, ED: Glucose-Capillary: 255 mg/dL — ABNORMAL HIGH (ref 70–99)

## 2018-07-08 LAB — TROPONIN I
Troponin I: 0.05 ng/mL (ref <0.03)
Troponin I: 0.25 ng/mL (ref <0.03)
Troponin I: 0.58 ng/mL (ref <0.03)

## 2018-07-08 LAB — TSH: TSH: 3.235 u[IU]/mL (ref 0.350–4.500)

## 2018-07-08 LAB — APTT: aPTT: 189 seconds (ref 24–36)

## 2018-07-08 LAB — POC OCCULT BLOOD, ED: Fecal Occult Bld: POSITIVE — AB

## 2018-07-08 LAB — BETA-HYDROXYBUTYRIC ACID: Beta-Hydroxybutyric Acid: 0.64 mmol/L — ABNORMAL HIGH (ref 0.05–0.27)

## 2018-07-08 LAB — MRSA PCR SCREENING: MRSA by PCR: NEGATIVE

## 2018-07-08 LAB — MAGNESIUM: Magnesium: 2.5 mg/dL — ABNORMAL HIGH (ref 1.7–2.4)

## 2018-07-08 MED ORDER — INSULIN ASPART 100 UNIT/ML ~~LOC~~ SOLN
0.0000 [IU] | SUBCUTANEOUS | Status: DC
  Administered 2018-07-08 (×2): 2 [IU] via SUBCUTANEOUS
  Administered 2018-07-09: 1 [IU] via SUBCUTANEOUS

## 2018-07-08 MED ORDER — SODIUM CHLORIDE 0.9 % IV SOLN: 10.0000 mL/h | Freq: Once | INTRAVENOUS | Status: DC

## 2018-07-08 MED ORDER — SODIUM CHLORIDE 0.9 % IV SOLN
80.0000 mg | INTRAVENOUS | Status: AC
  Administered 2018-07-08: 80 mg via INTRAVENOUS
  Filled 2018-07-08: qty 40

## 2018-07-08 MED ORDER — POTASSIUM CHLORIDE 10 MEQ/100ML IV SOLN
10.0000 meq | INTRAVENOUS | Status: AC
  Filled 2018-07-08: qty 100

## 2018-07-08 MED ORDER — SODIUM CHLORIDE 0.9 % IV SOLN
1.0000 g | INTRAVENOUS | Status: DC
  Filled 2018-07-08: qty 10

## 2018-07-08 MED ORDER — LACTATED RINGERS IV BOLUS: 2000.0000 mL | Freq: Once | INTRAVENOUS | Status: DC

## 2018-07-08 MED ORDER — VITAMIN K1 10 MG/ML IJ SOLN
10.0000 mg | Freq: Once | INTRAVENOUS | Status: AC
  Administered 2018-07-08: 10 mg via INTRAVENOUS
  Filled 2018-07-08 (×2): qty 1

## 2018-07-08 MED ORDER — PANTOPRAZOLE SODIUM 40 MG IV SOLR: 80.0000 mg | Freq: Once | INTRAVENOUS | Status: DC

## 2018-07-08 MED ORDER — LACTATED RINGERS IV BOLUS
1000.0000 mL | Freq: Once | INTRAVENOUS | Status: AC
  Administered 2018-07-08: 1000 mL via INTRAVENOUS

## 2018-07-08 MED ORDER — SODIUM CHLORIDE 0.9 % IV SOLN
INTRAVENOUS | Status: DC
  Filled 2018-07-08: qty 1

## 2018-07-08 MED ORDER — LACTATED RINGERS IV SOLN
INTRAVENOUS | Status: DC
  Administered 2018-07-08: 08:00:00 via INTRAVENOUS

## 2018-07-08 MED ORDER — SODIUM CHLORIDE 0.9 % IV SOLN
INTRAVENOUS | Status: DC
  Administered 2018-07-08 – 2018-07-09 (×2): via INTRAVENOUS

## 2018-07-08 MED ORDER — TETANUS-DIPHTH-ACELL PERTUSSIS 5-2.5-18.5 LF-MCG/0.5 IM SUSP
0.5000 mL | Freq: Once | INTRAMUSCULAR | Status: AC
  Administered 2018-07-08: 0.5 mL via INTRAMUSCULAR
  Filled 2018-07-08: qty 0.5

## 2018-07-08 MED ORDER — PANTOPRAZOLE SODIUM 40 MG IV SOLR
40.0000 mg | Freq: Two times a day (BID) | INTRAVENOUS | Status: DC
  Administered 2018-07-08 – 2018-07-10 (×4): 40 mg via INTRAVENOUS
  Filled 2018-07-08 (×4): qty 40

## 2018-07-08 NOTE — ED Provider Notes (Addendum)
[facility] EMERGENCY DEPARTMENT Provider Note   CSN: [id number] Arrival date & time: 07/08/18  4098     History   Chief Complaint Chief Complaint  Patient presents with  . Atrial Fibrillation    HPI Eric Calderon is a 77 y.o. male.  HPI 77 year old male comes in with chief complaint of fall. Patient has history of CAD, chronic A. fib on Coumadin, COPD, CHF.  According to patient, he has gradually gotten worse over the past 10 days.  Patient has lost his appetite and gotten weaker.  Now every time he gets up he is dizzy, legs give out and he falls down.  Patient has had 4 falls in the last 12 hours.  From the fall itself, patient is complaining of mild headache and left-sided chest pain and skin tear and bleeding.  Patient denies any dizziness at rest, and dizziness is described as unsteadiness, and there is no associated vision change, new numbness or tingling.  Review of system is also positive for dark stools which started few days ago.  Past Medical History:  Diagnosis Date  . Atrial fibrillation with rapid ventricular response (HCC)   . CAD S/P percutaneous coronary angioplasty October 2006   CATH: 80% MID LAD, involving diagonal bifurcation; 99%  AV GROOVE  CIRCUMFLEX --> PCI of circumflex with 3.0 mm 13 and a Cypher DES; planned staged LAD PCI  . CAD, multiple vessel November 2007   LAD lesion persists, 80% pre-D1 with 60% D1 and distal down the one lesion. 70% ISR and circumflex, L. PDA 80%; nondominant RCA - diffuse 80-70%  . Chronic atrial fibrillation (HCC)    Rate control with beta blocker, anticoagulated warfarin. This patients CHA2DS2-VASc Score and unadjusted Ischemic Stroke Rate (% per year) is equal to 3.2 % stroke rate/year from a score of 3  . COPD (chronic obstructive pulmonary disease) (HCC)    Greater than 100 pack year smoking history, quit 2009  . Diabetes mellitus without complication (HCC)    2012  . Essential hypertension   .  Exertional dyspnea Since 2006   a) 8/'11 Myoview: No Ischemic/Infarct; Echo: Nl LV size & fxn, EF 55-60%, mod-Severe LA & RA dilation,mild AoV sclerosis, Mild PHTN;; 4/'14 Echo: LV EF  55-60%, aortic sclerosis, moderate MR, moderate to severe LA and RA dilation, increased RV pressure -mild PHtn; 9/'15: Lexiscan Myovew: No Ischmia or Infarct, Afib - no EF.  [name] Hyperlipidemia with target LDL less than 70   . Mild mitral regurgitation by prior echocardiography 03/2013   Echo 2019:  Mild MR  . 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 Supratherapeutic INR   . Symptomatic anemia     Patient Active Problem List   Diagnosis Date Noted  . Atrial fibrillation with rapid ventricular response (HCC) 07/08/2018  . Supratherapeutic INR 07/08/2018  . Elevated troponin 07/08/2018  . Diabetes mellitus without complication (HCC)   . Symptomatic anemia   . COPD (chronic obstructive pulmonary disease) (HCC)   . Post-nasal drip 04/04/2014  . Emphysema lung (HCC) 02/06/2014  . Moderate mitral regurgitation 02/06/2014  . 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 Chronic atrial fibrillation (HCC); CHA2DS2-VASc Score - 4 03/11/2013  . Long term current use of anticoagulant therapy 03/11/2013  . Exertional dyspnea 10/04/2008  . Hyperlipidemia with target LDL less than 70 10/01/2008  .  Essential hypertension 10/01/2008    Past Surgical History:  Procedure Laterality Date  . CARDIAC CATHETERIZATION  2007   CONCLUSION SIGNIFICANT THREE VESSEL CAD  2.NORMAL LEFT VENTRICULAR SYSTOLIC FUNCTION  . CORONARY ARTERY BYPASS GRAFT  2008   x4  . NM MYOVIEW LTD  September 2015   Non-gated. Low risk. No ischemia or infarct  . TRANSTHORACIC ECHOCARDIOGRAM  04/02/2013   EF 55-60%. No regional W. May. Moderate MR, moderate to severely dilated LA. Mild RV pressure increase with  moderate RA dilation. Moderate TR.  [name] TRANSTHORACIC ECHOCARDIOGRAM  05/2018   Normal LV size.  Moderate LVH.  EF 55-60% with no regional wall motion abnormality.  Unable to assess diastolic function.  Severe biatrial enlargement.  Mild MR        Home Medications    Prior to Admission medications   Medication Sig Start Date End Date Taking? Authorizing Provider  allopurinol (ZYLOPRIM) 100 MG tablet Take 1 tablet by mouth daily as needed (for gout).  07/19/13  Yes [provider]  fenofibrate 160 MG tablet Take 1 tablet by mouth daily. 06/01/13  Yes [provider]  furosemide (LASIX) 40 MG tablet TAKE 1 TABLET(40 MG) BY MOUTH DAILY 06/03/18  Yes Marykay Lex, MD  glipiZIDE (GLUCOTROL XL) 10 MG 24 hr tablet Take by mouth daily with breakfast.  07/30/14  Yes [provider]  indomethacin (INDOCIN SR) 75 MG CR capsule Take 75 mg by mouth 2 (two) times daily as needed for mild pain (gout symptoms).    Yes [provider]  JANUMET XR [phone] MG TB24 Take 1 tablet by mouth daily. 03/27/16  Yes [provider]  metoprolol tartrate (LOPRESSOR) 25 MG tablet Take 0.5 tablets (12.5 mg total) by mouth 2 (two) times daily. 06/03/18  Yes Marykay Lex, MD  potassium chloride (K-DUR,KLOR-CON) 10 MEQ tablet Take 1 tablet (10 mEq total) by mouth daily. 06/03/18  Yes Marykay Lex, MD  simvastatin (ZOCOR) 40 MG tablet TAKE 1 TABLET(40 MG) BY MOUTH AT BEDTIME 05/31/17  Yes Marykay Lex, MD  warfarin (COUMADIN) 5 MG tablet Per INR 05/16/13  Yes [provider]  LANCETS SUPER THIN 28G MISC  01/26/14   [provider]  TRUETEST TEST test strip Inject 1 strip into the skin daily. Use 1 test strip each to check glucose daily 03/09/15   [provider]    Family History Family History  Problem Relation Age of Onset  . Stroke Mother   . Cancer Father     Social History Social History   Tobacco Use  . Smoking status: Former Smoker     Packs/day: 2.00    Years: 55.00    Pack years: 110.00    Last attempt to quit: 07/29/2007    Years since quitting: 10.9  . Smokeless tobacco: Never Used  Substance Use Topics  . Alcohol use: Yes    Alcohol/week: 16.8 oz    Types: 28 Cans of beer per week  . Drug use: No     Allergies   Niaspan [niacin er]   Review of Systems Review of Systems  Constitutional: Positive for activity change.  Cardiovascular: Positive for chest pain.  Skin: Positive for rash and wound.  Neurological: Positive for dizziness.  Hematological: Bruises/bleeds easily.  All other systems reviewed and are negative.    Physical Exam Updated Vital Signs BP 112/70   Pulse 85   Temp (!) 97.3 F (36.3 C) (Oral)   Resp 17  SpO2 96%   Physical Exam  Constitutional: He is oriented to person, place, and time. He appears well-developed.  HENT:  Head: Atraumatic.  Eyes: Pupils are equal, round, and reactive to light. EOM are normal.  Neck: Neck supple.  No midline C-spine tenderness  Cardiovascular:  Tachycardia and irregularly irregular heart rhythm  Pulmonary/Chest: Effort normal.  Abdominal: Soft.  + melena  Musculoskeletal:  Patient has left-sided chest wall tenderness, patient has skin tear over the left wrist, left elbow.  No deep laceration.  Mild bleeding over the left scalp, no deep laceration there either.  OTHERWISE: No facial abrasions, no spine step offs, crepitus of the chest or neck, no tenderness to palpation of the bilateral upper and lower extremities, no gross deformities, no chest tenderness, no pelvic pain.   Neurological: He is alert and oriented to person, place, and time.  Skin: Skin is warm.  Nursing note and vitals reviewed.    ED Treatments / Results  Labs (all labs ordered are listed, but only abnormal results are displayed) Labs Reviewed  BASIC METABOLIC PANEL - Abnormal; Notable for the following components:      Result Value   Sodium 131 (*)    Chloride 96  (*)    CO2 12 (*)    Glucose, Bld 297 (*)    BUN 101 (*)    Creatinine, Ser 3.02 (*)    Calcium 8.3 (*)    GFR calc non Af Amer 19 (*)    GFR calc Af Amer 22 (*)    Anion gap 23 (*)    All other components within normal limits  MAGNESIUM - Abnormal; Notable for the following components:   Magnesium 2.5 (*)    All other components within normal limits  CBC - Abnormal; Notable for the following components:   WBC 26.0 (*)    RBC 2.70 (*)    Hemoglobin 7.9 (*)    HCT 25.0 (*)    Platelets 448 (*)    All other components within normal limits  TROPONIN I - Abnormal; Notable for the following components:   Troponin I 0.05 (*)    All other components within normal limits  PROTIME-INR - Abnormal; Notable for the following components:   Prothrombin Time >90.0 (*)    INR >10.00 (*)    All other components within normal limits  APTT - Abnormal; Notable for the following components:   aPTT 189 (*)    All other components within normal limits  POC OCCULT BLOOD, ED - Abnormal; Notable for the following components:   Fecal Occult Bld POSITIVE (*)    All other components within normal limits  I-STAT VENOUS BLOOD GAS, ED - Abnormal; Notable for the following components:   pCO2, Ven 29.5 (*)    pO2, Ven 18.0 (*)    Bicarbonate 17.4 (*)    TCO2 18 (*)    Acid-base deficit 7.0 (*)    All other components within normal limits  I-STAT CG4 LACTIC ACID, ED - Abnormal; Notable for the following components:   Lactic Acid, Venous 4.98 (*)    All other components within normal limits  TSH  URINALYSIS, ROUTINE W REFLEX MICROSCOPIC  BASIC METABOLIC PANEL  BASIC METABOLIC PANEL  BASIC METABOLIC PANEL  BASIC METABOLIC PANEL  BETA-HYDROXYBUTYRIC ACID  HEPATIC FUNCTION PANEL  CBG MONITORING, ED  TYPE AND SCREEN  PREPARE RBC (CROSSMATCH)  PREPARE FRESH FROZEN PLASMA    EKG EKG Interpretation  Date/Time:  Tuesday July 08 2018 07:00:24 EDT  Ventricular Rate:  121 PR Interval:    QRS  Duration: 106 QT Interval:  327 QTC Calculation: 464 R Axis:   69 Text Interpretation:  Atrial fibrillation with rapid ventricular response Repol abnrm suggests ischemia, diffuse leads - probably rate-related When compared with ECG of 02/09/2017, Atrial fibrillation with rapid ventricular response has replaced Sinus bradycardia ST-t abnormality is more prominent - probably rate-related Confirmed by Dione Booze [phone]) on 07/08/2018 7:06:24 AM   Radiology Dg Ribs Unilateral W/chest Left  Result Date: 07/08/2018 CLINICAL DATA:  Multiple falls over the last week, left lower rib pain EXAM: LEFT RIBS AND CHEST - 3+ VIEW COMPARISON:  Chest x-ray of 02/23/2014 FINDINGS: No active infiltrate or effusion is seen. The lungs are slightly hyperaerated. Mediastinal and hilar contours are unremarkable. Median sternotomy sutures are noted from prior CABG. The heart size is stable. Left rib detail films show nondisplaced fractures of the very anterior left eighth and ninth ribs. IMPRESSION: 1. Nondisplaced fractures of the anterior left eighth and ninth ribs. 2. No active lung disease. Electronically Signed   By: Dwyane Dee M.D.   On: 07/08/2018 08:37   Ct Head Wo Contrast  Result Date: 07/08/2018 CLINICAL DATA:  Fall, headache, neck pain EXAM: CT HEAD WITHOUT CONTRAST CT CERVICAL SPINE WITHOUT CONTRAST TECHNIQUE: Multidetector CT imaging of the head and cervical spine was performed following the standard protocol without intravenous contrast. Multiplanar CT image reconstructions of the cervical spine were also generated. COMPARISON:  None. FINDINGS: CT HEAD FINDINGS Brain: Diffuse cerebral atrophy. No acute intracranial abnormality. Specifically, no hemorrhage, hydrocephalus, mass lesion, acute infarction, or significant intracranial injury. Vascular: No hyperdense vessel or unexpected calcification. Skull: No acute calvarial abnormality. Sinuses/Orbits: Visualized paranasal sinuses and mastoids clear. Orbital soft  tissues unremarkable. Other: None CT CERVICAL SPINE FINDINGS Alignment: No subluxation Skull base and vertebrae: No acute fracture. No primary bone lesion or focal pathologic process. Soft tissues and spinal canal: No prevertebral fluid or swelling. No visible canal hematoma. Disc levels: Disc space narrowing and spurring from C4-5 through C6-7. Upper chest: Biapical scarring Other: No acute findings.  Carotid artery calcifications. IMPRESSION: Diffuse cerebral atrophy.  No acute intracranial abnormality. 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 No acute bony abnormality. Electronically Signed   By: Charlett Nose M.D.   On: 07/08/2018 08:11   Ct Cervical Spine Wo Contrast  Result Date: 07/08/2018 CLINICAL DATA:  Fall, headache, neck pain EXAM: CT HEAD WITHOUT CONTRAST CT CERVICAL SPINE WITHOUT CONTRAST TECHNIQUE: Multidetector CT imaging of the head and cervical spine was performed following the standard protocol without intravenous contrast. Multiplanar CT image reconstructions of the cervical spine were also generated. COMPARISON:  None. FINDINGS: CT HEAD FINDINGS Brain: Diffuse cerebral atrophy. No acute intracranial abnormality. Specifically, no hemorrhage, hydrocephalus, mass lesion, acute infarction, or significant intracranial injury. Vascular: No hyperdense vessel or unexpected calcification. Skull: No acute calvarial abnormality. Sinuses/Orbits: Visualized paranasal sinuses and mastoids clear. Orbital soft tissues unremarkable. Other: None CT CERVICAL SPINE FINDINGS Alignment: No subluxation Skull base and vertebrae: No acute fracture. No primary bone lesion or focal pathologic process. Soft tissues and spinal canal: No prevertebral fluid or swelling. No visible canal hematoma. Disc levels: Disc space narrowing and spurring from C4-5 through C6-7. Upper chest: Biapical scarring Other: No acute findings.  Carotid artery calcifications. IMPRESSION: Diffuse cerebral atrophy.  No acute  intracranial abnormality. 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 No acute bony abnormality. Electronically Signed   By: Caryn Bee  Dover M.D.   On: 07/08/2018 08:11    Procedures Procedures (including critical care time)  CRITICAL CARE Performed by: Hayleen Clinkscales   Total critical care time: 58 minutes  Critical care time was exclusive of separately billable procedures and treating other patients.  Critical care was necessary to treat or prevent imminent or life-threatening deterioration.  Critical care was time spent personally by me on the following activities: development of treatment plan with patient and/or surrogate as well as nursing, discussions with consultants, evaluation of patient's response to treatment, examination of patient, obtaining history from patient or surrogate, ordering and performing treatments and interventions, ordering and review of laboratory studies, ordering and review of radiographic studies, pulse oximetry and re-evaluation of patient's condition.   Medications Ordered in ED Medications  lactated ringers infusion ( Intravenous New Bag/Given 07/08/18 0823)  0.9 %  sodium chloride infusion (has no administration in time range)  phytonadione (VITAMIN K) 10 mg in dextrose 5 % 50 mL IVPB (10 mg Intravenous New Bag/Given 07/08/18 1034)  0.9 %  sodium chloride infusion (has no administration in time range)  pantoprazole (PROTONIX) 80 mg in sodium chloride 0.9 % 100 mL IVPB (has no administration in time range)  insulin regular (NOVOLIN R,HUMULIN R) 100 Units in sodium chloride 0.9 % 100 mL (1 Units/mL) infusion (has no administration in time range)  potassium chloride 10 mEq in 100 mL IVPB (has no administration in time range)  lactated ringers bolus 1,000 mL (0 mLs Intravenous Stopped 07/08/18 0923)  Tdap (BOOSTRIX) injection 0.5 mL (0.5 mLs Intramuscular Given 07/08/18 0826)  lactated ringers bolus 1,000 mL (1,000 mLs Intravenous New Bag/Given  07/08/18 0930)     Initial Impression / Assessment and Plan / ED Course  I have reviewed the triage vital signs and the nursing notes.  Pertinent labs & imaging results that were available during my care of the patient were reviewed by me and considered in my medical decision making (see chart for details).  Clinical Course as of Jul 09 1043  Tue Jul 08, 2018  1610 Likely because of acute distress.  We do not think patient has severe sepsis at this time or underlying infection.  WBC(!): 26.0 [AN]  0957 Patient has melena.  Hemoglobin is 7.9.  INR is greater than 10.  Dr. Ewing Schlein, GI will see the patient. I spoke with critical care, Dr. Marchelle Gearing thinks patient can be admitted to stepdown ICU, and ICU can be consulted if patient decompensates.   Fecal Occult Blood, POC(!): POSITIVE [AN]  0958 10 mg of IV vitamin K, 2 units of FFP, 2 units of packed RBC ordered. Blood transfusion given because of symptomatic anemia.  INR(!!): >10.00 [AN]  1002 Multifactorial. Patient has elevated blood sugar, we will get a venous blood gas.  He also has acute kidney injury, and dehydration which are likely contributing. Venous blood gas shows pH of 7.37, therefore patient does not appear to be grossly decompensated. We will add beta doxy hydroxybutyrate and lactic acid.  Insulin drip will be initiated -but again, I do not think patient is in DKA based on labs alone.  Anion gap(!): 23 [AN]  1041 The patient is noted to have a lactate>4. With the current information available to me, I don't think the patient is in septic shock. The lactate>4, is related to OTHER SHOCK (hypovolemia, acute GI bleed).   Lactic Acid, Venous(!!): 4.98 [AN]  1042 Patient is still in A. fib, with heart rate between 100-1 20. I suspect  that the underlying cause is anemia and dehydration along with renal failure.  We will continue to resuscitate, and we suspect that his heart rate will improve.  We will not be starting patient on any  drips right now.  No chest pain, shortness of breath, dizziness which is reassuring.   [AN]    Clinical Course User Index [AN] Derwood Kaplan, MD   77 year old male comes in a chief complaint of weakness and resultant fall.  It seems like his symptoms are gradually gotten worse over the past 10 days, and now every time he gets up and tries to walk his legs give out and he has some dizziness as well.  Patient has history of A. fib, CAD, COPD and he is on Coumadin.  He is noted to be in A. fib with RVR with hypotension.  History not suggestive of any clear source of infection, we will get urine analysis and also x-ray of his chest.  Differential diagnosis does include AKI, electrolyte abnormalities, subdural hematoma, multiple rib fractures.  Appropriate labs have been ordered.   Final Clinical Impressions(s) / ED Diagnoses   Final diagnoses:  Atrial fibrillation with RVR (HCC)  Melena  AKI (acute kidney injury) (HCC)  Uremia  Closed fracture of multiple ribs of left side, initial encounter    ED Discharge Orders        Ordered    Amb referral to AFIB Clinic     07/08/18 0730       Derwood Kaplan, MD 07/08/18 1042    Derwood Kaplan, MD 07/08/18 1044

## 2018-07-08 NOTE — H&P (Addendum)
History and Physical    Eric MorseRobert R Exline ZOX:[id number] DOB: 02/18/1941 DOA: 07/08/2018  PCP: Marva PandaMillsaps, Kimberly, NP Patient coming from: home  Chief Complaint: weakness/falls/melena  HPI: Eric MorseRobert R Calderon is a 77 y.o. male with medical history significant make A. Fib on Coumadin, CAD status post CABG 4 2008, hyperlipidemia, hypertension, diabetes, COPD not on home oxygen presents to the emergency Department chief complaint of persistent worsening generalized weakness with multiple falls. Initial evaluation reveals likely GI bleed secondary to supratherapeutic INR. [facility] are asked to admit.  Information is obtained from the patient. He reports 10 days ago he developed generalized weakness particularly in his legs, gait instability leading to a fall. Associated symptoms include decreased oral intake due to no appetite, and melena. He denies headache dizziness syncope or near-syncope. He denies abdominal pain nausea vomiting diarrhea constipation bright red blood per rectum. He denies any dysuria hematuria frequency or urgency. He continued to take his home medications which include Coumadin. [place] and continued his home medications. During the trip he developed a gout flare and took a brief course of indomethacin. He believes he had worsening gait instability with more frequent falls after this. 2- 3 days ago he began to notice bruising after his falls. He reports he gets his INR checked every 3 months. He denies chest pain palpitation shortness of breath diaphoresis lower extremity edema. He denies EtOH use. He's never had an EGD or colonoscopy.    ED Course: in the emergency department is afebrile hypotensive has an EKG revealing atrial fibrillation with rapid ventricular response slightly elevated troponin INR greater than 10 heme positive stool hemoglobin 7.7. He is provided with vitamin K intravenously IV fluids first frozen plasma and packed  red blood cells. At the time of admission he is hemodynamically stable afebrile and not hypoxic.  Review of Systems: As per HPI otherwise all other systems reviewed and are negative.   Ambulatory Status: ambulates independently is independent with ADLs  Past Medical History:  Diagnosis Date  . Atrial fibrillation with rapid ventricular response (HCC)   . CAD S/P percutaneous coronary angioplasty October 2006   CATH: 80% MID LAD, involving diagonal bifurcation; 99%  AV GROOVE  CIRCUMFLEX --> PCI of circumflex with 3.0 mm 13 and a Cypher DES; planned staged LAD PCI  . CAD, multiple vessel November 2007   LAD lesion persists, 80% pre-D1 with 60% D1 and distal down the one lesion. 70% ISR and circumflex, L. PDA 80%; nondominant RCA - diffuse 80-70%  . Chronic atrial fibrillation (HCC)    Rate control with beta blocker, anticoagulated warfarin. This patients CHA2DS2-VASc Score and unadjusted Ischemic Stroke Rate (% per year) is equal to 3.2 % stroke rate/year from a score of 3  . COPD (chronic obstructive pulmonary disease) (HCC)    Greater than 100 pack year smoking history, quit 2009  . Diabetes mellitus without complication (HCC)    2012  . Essential hypertension   . Exertional dyspnea Since 2006   a) 8/'11 Myoview: No Ischemic/Infarct; Echo: Nl LV size & fxn, EF 55-60%, mod-Severe LA & RA dilation,mild AoV sclerosis, Mild PHTN;; 4/'14 Echo: LV EF  55-60%, aortic sclerosis, moderate MR, moderate to severe LA and RA dilation, increased RV pressure -mild PHtn; 9/'15: Lexiscan Myovew: No Ischmia or Infarct, Afib - no EF.  . GI bleed   . Hyperlipidemia with target LDL less than 70   . Mild mitral regurgitation by prior echocardiography 03/2013  Echo 2019:  Mild MR  . Rib fracture   . 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 Supratherapeutic INR   . Symptomatic anemia     Past Surgical History:  Procedure Laterality Date  . CARDIAC CATHETERIZATION  2007    CONCLUSION SIGNIFICANT THREE VESSEL CAD  2.NORMAL LEFT VENTRICULAR SYSTOLIC FUNCTION  . CORONARY ARTERY BYPASS GRAFT  2008   x4  . NM MYOVIEW LTD  September 2015   Non-gated. Low risk. No ischemia or infarct  . TRANSTHORACIC ECHOCARDIOGRAM  04/02/2013   EF 55-60%. No regional W. May. Moderate MR, moderate to severely dilated LA. Mild RV pressure increase with moderate RA dilation. Moderate TR.  [name] TRANSTHORACIC ECHOCARDIOGRAM  05/2018   Normal LV size.  Moderate LVH.  EF 55-60% with no regional wall motion abnormality.  Unable to assess diastolic function.  Severe biatrial enlargement.  Mild MR    Social History   Socioeconomic History  . Marital status: Single    Spouse name: Not on file  . Number of children: Not on file  . Years of education: Not on file  . Highest education level: Not on file  Occupational History  . Not on file  Social Needs  . Financial resource strain: Not on file  . Food insecurity:    Worry: Not on file    Inability: Not on file  . Transportation needs:    Medical: Not on file    Non-medical: Not on file  Tobacco Use  . Smoking status: Former Smoker    Packs/day: 2.00    Years: 55.00    Pack years: 110.00    Last attempt to quit: 07/29/2007    Years since quitting: 10.9  . Smokeless tobacco: Never Used  Substance and Sexual Activity  . Alcohol use: Yes    Alcohol/week: 16.8 oz    Types: 28 Cans of beer per week  . Drug use: No  . Sexual activity: Not on file  Lifestyle  . Physical activity:    Days per week: Not on file    Minutes per session: Not on file  . Stress: Not on file  Relationships  . Social connections:    Talks on phone: Not on file    Gets together: Not on file    Attends religious service: Not on file    Active member of club or organization: Not on file    Attends meetings of clubs or organizations: Not on file    Relationship status: Not on file  . Intimate partner violence:    Fear of current or ex partner: Not on file      Emotionally abused: Not on file    Physically abused: Not on file    Forced sexual activity: Not on file  Other Topics Concern  . Not on file  Social History Narrative   He is a divorced father 3, grandfather of 3. He enjoys walking on the treadmill for roughly 30 minutes at a time daily. No symptoms of that. He also while systolic daily. He doesn't drink alcohol and quit smoking years ago.    Allergies  Allergen Reactions  . Niaspan [Niacin Er]     Family History  Problem Relation Age of Onset  . Stroke Mother   . Cancer Father     Prior to Admission medications   Medication Sig Start Date End Date Taking? Authorizing Provider  allopurinol (ZYLOPRIM) 100 MG tablet Take 1 tablet by  mouth daily as needed (for gout).  07/19/13  Yes [provider]  fenofibrate 160 MG tablet Take 1 tablet by mouth daily. 06/01/13  Yes [provider]  furosemide (LASIX) 40 MG tablet TAKE 1 TABLET(40 MG) BY MOUTH DAILY 06/03/18  Yes Marykay Lex, MD  glipiZIDE (GLUCOTROL XL) 10 MG 24 hr tablet Take by mouth daily with breakfast.  07/30/14  Yes [provider]  indomethacin (INDOCIN SR) 75 MG CR capsule Take 75 mg by mouth 2 (two) times daily as needed for mild pain (gout symptoms).    Yes [provider]  JANUMET XR [phone] MG TB24 Take 1 tablet by mouth daily. 03/27/16  Yes [provider]  metoprolol tartrate (LOPRESSOR) 25 MG tablet Take 0.5 tablets (12.5 mg total) by mouth 2 (two) times daily. 06/03/18  Yes Marykay Lex, MD  potassium chloride (K-DUR,KLOR-CON) 10 MEQ tablet Take 1 tablet (10 mEq total) by mouth daily. 06/03/18  Yes Marykay Lex, MD  simvastatin (ZOCOR) 40 MG tablet TAKE 1 TABLET(40 MG) BY MOUTH AT BEDTIME 05/31/17  Yes Marykay Lex, MD  warfarin (COUMADIN) 5 MG tablet Per INR 05/16/13  Yes [provider]  LANCETS SUPER THIN 28G MISC  01/26/14   [provider]  TRUETEST TEST test strip Inject 1 strip into the skin  daily. Use 1 test strip each to check glucose daily 03/09/15   [provider]    Physical Exam: Vitals:   07/08/18 1437 07/08/18 1614 07/08/18 1643 07/08/18 1653  BP: (!) 114/98 (!) 97/58 (!) 116/57   Pulse: 93 (!) 101 (!) 108   Resp: (!) 22 19 19    Temp: 97.8 F (36.6 C) (!) 97.4 F (36.3 C) (!) 97.3 F (36.3 C) (!) 97.5 F (36.4 C)  TempSrc: Oral Oral Oral Axillary  SpO2: 99% 100% 100% 98%     General:  Appears calm and comfortable, slightly pale/jaundice somewhat frail Eyes:  PERRL, EOMI, normal lids, iris ENT:  grossly normal hearing, lips & tongue, mucous membranes of his mouth are dry and pale Neck:  no LAD, masses or thyromegaly Cardiovascular:  Irregularly irregular, no m/r/g. No LE edema.  Respiratory:  CTA bilaterally, no w/r/r. Normal respiratory effort. Abdomen:  soft, ntnd, positive bowel sounds throughout but sluggish. No guarding or rebounding Skin:  no rash or induration seen on limited exam multiple skin tears on his forearms one on his forehead. Curlex around finger on left hand due to bleeding skin tear. Musculoskeletal:  grossly normal tone BUE/BLE, good ROM, no bony abnormality Psychiatric:  grossly normal mood and affect, speech fluent and appropriate, AOx3 Neurologic:  CN 2-12 grossly intact, moves all extremities in coordinated fashion, sensation intact. Speech clear facial symmetry moving all extremities spontaneously  Labs on Admission: I have personally reviewed following labs and imaging studies  CBC: Recent Labs  Lab 07/08/18 0729  WBC 26.0*  HGB 7.9*  HCT 25.0*  MCV 92.6  PLT 448*   Basic Metabolic Panel: Recent Labs  Lab 07/08/18 0729 07/08/18 1031 07/08/18 1502  NA 131* 132* 135  K 4.0 3.8 3.2*  CL 96* 99 102  CO2 12* 18* 21*  GLUCOSE 297* 263* 235*  BUN 101* 99* 96*  CREATININE 3.02* 2.62* 2.18*  CALCIUM 8.3* 7.9* 8.1*  MG 2.5*  --   --    GFR: CrCl cannot be calculated (Unknown ideal weight.). Liver Function  Tests: Recent Labs  Lab 07/08/18 1031  AST 50*  ALT 20  ALKPHOS 36*  BILITOT 1.2  PROT 4.7*  ALBUMIN 1.9*   No results for input(s): LIPASE, AMYLASE in the last 168 hours. No results for input(s): AMMONIA in the last 168 hours. Coagulation Profile: Recent Labs  Lab 07/08/18 0820  INR >10.00*   Cardiac Enzymes: Recent Labs  Lab 07/08/18 0729 07/08/18 1502  TROPONINI 0.05* 0.25*   BNP (last 3 results) No results for input(s): PROBNP in the last 8760 hours. HbA1C: Recent Labs    07/08/18 0729  HGBA1C 7.4*   CBG: Recent Labs  Lab 07/08/18 1119  GLUCAP 255*   Lipid Profile: No results for input(s): CHOL, HDL, LDLCALC, TRIG, CHOLHDL, LDLDIRECT in the last 72 hours. Thyroid Function Tests: Recent Labs    07/08/18 0730  TSH 3.235   Anemia Panel: No results for input(s): VITAMINB12, FOLATE, FERRITIN, TIBC, IRON, RETICCTPCT in the last 72 hours. Urine analysis:    Component Value Date/Time   COLORURINE YELLOW 07/08/2018 1031   APPEARANCEUR HAZY (A) 07/08/2018 1031   LABSPEC 1.009 07/08/2018 1031   PHURINE 5.0 07/08/2018 1031   GLUCOSEU >=500 (A) 07/08/2018 1031   HGBUR LARGE (A) 07/08/2018 1031   BILIRUBINUR NEGATIVE 07/08/2018 1031   KETONESUR NEGATIVE 07/08/2018 1031   PROTEINUR NEGATIVE 07/08/2018 1031   NITRITE NEGATIVE 07/08/2018 1031   LEUKOCYTESUR LARGE (A) 07/08/2018 1031    Creatinine Clearance: CrCl cannot be calculated (Unknown ideal weight.).  Sepsis Labs: @LABRCNTIP (procalcitonin:4,lacticidven:4) )No results found for this or any previous visit (from the past 240 hour(s)).   Radiological Exams on Admission: Dg Ribs Unilateral W/chest Left  Result Date: 07/08/2018 CLINICAL DATA:  Multiple falls over the last week, left lower rib pain EXAM: LEFT RIBS AND CHEST - 3+ VIEW COMPARISON:  Chest x-ray of 02/23/2014 FINDINGS: No active infiltrate or effusion is seen. The lungs are slightly hyperaerated. Mediastinal and hilar contours are  unremarkable. Median sternotomy sutures are noted from prior CABG. The heart size is stable. Left rib detail films show nondisplaced fractures of the very anterior left eighth and ninth ribs. IMPRESSION: 1. Nondisplaced fractures of the anterior left eighth and ninth ribs. 2. No active lung disease. Electronically Signed   By: Dwyane Dee M.D.   On: 07/08/2018 08:37   Ct Head Wo Contrast  Result Date: 07/08/2018 CLINICAL DATA:  Fall, headache, neck pain EXAM: CT HEAD WITHOUT CONTRAST CT CERVICAL SPINE WITHOUT CONTRAST TECHNIQUE: Multidetector CT imaging of the head and cervical spine was performed following the standard protocol without intravenous contrast. Multiplanar CT image reconstructions of the cervical spine were also generated. COMPARISON:  None. FINDINGS: CT HEAD FINDINGS Brain: Diffuse cerebral atrophy. No acute intracranial abnormality. Specifically, no hemorrhage, hydrocephalus, mass lesion, acute infarction, or significant intracranial injury. Vascular: No hyperdense vessel or unexpected calcification. Skull: No acute calvarial abnormality. Sinuses/Orbits: Visualized paranasal sinuses and mastoids clear. Orbital soft tissues unremarkable. Other: None CT CERVICAL SPINE FINDINGS Alignment: No subluxation Skull base and vertebrae: No acute fracture. No primary bone lesion or focal pathologic process. Soft tissues and spinal canal: No prevertebral fluid or swelling. No visible canal hematoma. Disc levels: Disc space narrowing and spurring from C4-5 through C6-7. Upper chest: Biapical scarring Other: No acute findings.  Carotid artery calcifications. IMPRESSION: Diffuse cerebral atrophy.  No acute intracranial abnormality. 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 No acute bony abnormality. Electronically Signed   By: Charlett Nose M.D.   On: 07/08/2018 08:11   Ct Cervical Spine Wo Contrast  Result Date: 07/08/2018 CLINICAL DATA:  Fall, headache, neck pain EXAM: CT HEAD WITHOUT  CONTRAST CT CERVICAL SPINE WITHOUT CONTRAST TECHNIQUE: Multidetector CT imaging of the head and cervical spine was performed following the standard protocol without intravenous contrast. Multiplanar CT image reconstructions of the cervical spine were also generated. COMPARISON:  None. FINDINGS: CT HEAD FINDINGS Brain: Diffuse cerebral atrophy. No acute intracranial abnormality. Specifically, no hemorrhage, hydrocephalus, mass lesion, acute infarction, or significant intracranial injury. Vascular: No hyperdense vessel or unexpected calcification. Skull: No acute calvarial abnormality. Sinuses/Orbits: Visualized paranasal sinuses and mastoids clear. Orbital soft tissues unremarkable. Other: None CT CERVICAL SPINE FINDINGS Alignment: No subluxation Skull base and vertebrae: No acute fracture. No primary bone lesion or focal pathologic process. Soft tissues and spinal canal: No prevertebral fluid or swelling. No visible canal hematoma. Disc levels: Disc space narrowing and spurring from C4-5 through C6-7. Upper chest: Biapical scarring Other: No acute findings.  Carotid artery calcifications. IMPRESSION: Diffuse cerebral atrophy.  No acute intracranial abnormality. 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 No acute bony abnormality. Electronically Signed   By: Charlett Nose M.D.   On: 07/08/2018 08:11    EKG: Independently reviewed. Atrial fibrillation with rapid ventricular response Repol abnrm suggests ischemia, diffuse leads - probably rate-related   Assessment/Plan Principal Problem:   Symptomatic anemia Active Problems:   Atrial fibrillation with rapid ventricular response (HCC)   Supratherapeutic INR   Diabetes mellitus without complication (HCC)   GI bleeding   Acute kidney injury (HCC)   Hyperlipidemia with target LDL less than 70   Essential hypertension   Long term current use of anticoagulant therapy   3-vessel CAD - 70% ISR and circumflex, diffuse 87 the RCA  (nondominant), severe bifurcation LAD diagonal lesion 80 and 60% --> sent for CABG x4   COPD (chronic obstructive pulmonary disease) (HCC)   Elevated troponin   Metabolic acidosis   Chronic atrial fibrillation (HCC); CHA2DS2-VASc Score - 4   Rib fracture   Leukocytosis   #1. Symptomatic anemia secondary to GI bleed in the setting of a supratherapeutic INR and recent short-term NSAID use. Patient with generalized weakness and gait instability leading to frequent falls as well as melena. Heme positive stool. Hemoglobin 7.8. INR greater than 10. Home medications include Coumadin. 2 units of packed red blood cells are ordered in the emergency department -Admit to step down -agree with 2 units of packed red blood cells -Serial CBCs -hold Coumadin -evaluated by gastroenterology who opined probable upper GI bleed. Plan for EGD and colonoscopy once INR lowered  #2. GI bleed likely upper secondary to supratherapeutic INR in the setting of short-term NSAID use.provided with 80 mg of protonic's in the emergency department. Evaluated by gastroenterology -Monitor closely -continue PPI -Nothing by mouth for now -See #1  #3. Atrial fibrillation with rapid ventricular response. History of chronic A. Fib. Likely related to above. [place] score 5. Home medications include Coumadin, metoprolol. N.r. As noted above greater than 10. At time of admission heart rate 114. Initially he was hypotensive which is improved with 2 L lactated Ringer's. 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 Likely prerenal secondary to above. Creatinine greater than 3 on admission.chart review indicates baseline 1.6. -Hold nephrotoxins -Monitor urine output -Recheck in the morning  #5. Elevated lactic acid/metabolic acidosis. Likely related to dehydration in the setting of above. He did receive 2 L of  lactated Ringer's and currently has  continuous IV fluids going. Initial lactic acid 4.8. CO2 is 12. -Continue IV fluids -Track lactic acid -Recheck this afternoon  #6. Elevated troponin. Troponin 0.05. Likely related to demand. Patient denies chest pain. EKG as noted above. -trend troponin -monitor  #7. Hypertension. Initially patient systolic blood pressure low. He received IV fluids. At the time of admission systolic blood pressure 114. -Continue IV fluids -Hold home antihypertensive meds include Lasix and metoprolol -Monitor  #8. Fractured ribs. Related to recent falls. X-ray as noted above. -Supportive therapy -Physical therapy  #9. Diabetes. Home medications include oral agents. Serum glucose 263. Insulin drip initiated in the emergency department for and an ion gap of 23 CO2 of 12. Potassium 4.4. -stop insulin gtt - bmet 4 hours -obtain A1C -continue IV fluids -SSI every 4 hours  #10. CAD. Status post CABG 2008. Patient denies chest pain. EKG as noted above. -monitor  #11. Leukocytosis. ?UTI. Urinalysis with many bacteria WBC greater than 50. He is afebrile and hemodynamically stable.  -urine culture -rocephine   DVT prophylaxis: scd  Code Status: full  Family Communication: none present  Disposition Plan: home when ready  Consults called: magod gastroenterology  Admission status: inpatient    Gwenyth Bender MD [facility]  If 7PM-7AM, please contact night-coverage [email or web address] Password [facility]  07/08/2018, 5:54 PM

## 2018-07-08 NOTE — Progress Notes (Signed)
Will check INR this evening. If >3.0 will give 2 units of FFP.

## 2018-07-08 NOTE — Progress Notes (Signed)
Merlene Morseobert R Gavidia 4:42 PM  Subjective: Patient without any previous GI issues and no previous bleeding and case discussed with our resident and hospital computer chart reviewed and he's had no further bleeding  Objective: Vital signs stable afebrile no acute distress abdomen is soft nontenderlabs reviewed  Assessment: GI bleeding in patient with over anticoagulation and Coumadin and Indocin use  Plan: We will plan on endoscopy tomorrow and that procedure including the risks was discussed with the patient as long as INR is decreased and expect hemoglobin to drop as BUN and creatinine decrease and please call us sooner if signs of increased bleeding otherwise we'll allow clear liquids today and up to 8 AM tomorrow  [facility] PaMAGOD,Silus Lanzo E  Pager [phone] After 5PM or if no answer call [phone]

## 2018-07-08 NOTE — ED Triage Notes (Signed)
Pt arrived via [facility]; pt from home per EMS with c/o repeated falls 4 falls before 1a; increase weakness, poor appetite; pt hit head at some point with one of the falls; multiple skin tears; pt on coumadin; 100 systolic; BP stated in 80s until given 500 NS bolus; Hx Afib RVR 150s with PVCs, DM, CBG 291; 117/54, 124 (irreg), 97% on RA; 16; 20LFA

## 2018-07-08 NOTE — ED Notes (Signed)
Attempted to call report to floor 

## 2018-07-08 NOTE — Progress Notes (Signed)
Eric MorseRobert R Calderon [id number]  Code Status: DNR  Admission Data: 07/08/2018 7:42 PM  Attending Provider: Ophelia CharterYates  WGN:[id number]:Millsaps, Cala BradfordKimberly, NP  Consults/ Treatment Team: Treatment Team:  Vida RiggerMagod, Marc, MD  Eric MorseRobert R Buccheri is a 77 y.o. male patient admitted from ED awake, alert - oriented X 4 - no acute distress noted. VSS - Blood pressure 110/74, pulse (!) 120, temperature (!) 97.5 F (36.4 C), temperature source Axillary, resp. rate (!) 23, SpO2 100 %. no c/o shortness of breath, no c/o chest pain. Orientation to room, and floor completed with information packet given to patient/family. Admission INP armband ID verified with patient/family, and in place.  SR up x 2, fall assessment complete, with patient and family able to verbalize understanding of risk associated with falls, and verbalized understanding to call nsg before up out of bed. Call light within reach, patient able to voice, and demonstrate understanding. Skin clean and dry, abrasions and bruising to bil. arms.  No evidence of skin break down noted on exam. PRBCS and fresh frozen plasma infusing upon admission to unit. ?  Will cont to eval and treat per MD orders.  [name], RN  07/08/2018 7:42 PM

## 2018-07-08 NOTE — Consult Note (Addendum)
Referring Provider: Dr. Rhunette CroftNanavati Primary Care Physician:  Marva PandaMillsaps, Kimberly, NP Primary Gastroenterologist:  N/A  Reason for Consultation:  Supratherapeutic INR and GI Bleed  HPI: Eric MorseRobert R Calderon is a 77 y.o. male with atrial fibrillation on warfarin who presented to the ED with gait instability and melena. 10 days prior to presentation he was visiting family in [place] when he lost his appetite. He did not change his diet on this trip. Shortly after arriving home he developed a gout flare and started Indomethacin. After completing his course he noticed increased gait instability and inability to walk more than a few steps without falling. With the falls he has noticed increasing bruising and bleeding. In addition he has noticed melena for the past 10 days. He has minimal mass with his BMs but has noted them to be black, gummy, and foul smelling. His warfarin was not changed recently and he gets his INR checked once every 3 months.   He otherwise denies abdominal pain, N/V, worsening SOB (has chronic SOB), palpitations, dizziness with standing, generalized weakness, hematochezia, rash, fevers, chills. He does not use NSAIDs aside from the short course of Indomethacin recently completed. He does not drink EtOH. He does not use tobacco (former smoker, quit 10 years ago). He has never had an EGD or colonoscopy. Denies a family history of esophageal, gastric, colon, pancreatic cancer. Denies family history of IBD.   Past Medical History:  Diagnosis Date  . CAD S/P percutaneous coronary angioplasty October 2006   CATH: 80% MID LAD, involving diagonal bifurcation; 99%  AV GROOVE  CIRCUMFLEX --> PCI of circumflex with 3.0 mm 13 and a Cypher DES; planned staged LAD PCI  . CAD, multiple vessel November 2007   LAD lesion persists, 80% pre-D1 with 60% D1 and distal down the one lesion. 70% ISR and circumflex, L. PDA 80%; nondominant RCA - diffuse 80-70%  . Chronic atrial fibrillation (HCC)    Rate control with beta  blocker, anticoagulated warfarin. This patients CHA2DS2-VASc Score and unadjusted Ischemic Stroke Rate (% per year) is equal to 3.2 % stroke rate/year from a score of 3  . COPD (chronic obstructive pulmonary disease) (HCC)    Greater than 100 pack year smoking history, quit 2009  . Diabetes mellitus without complication (HCC)    2012  . Essential hypertension   . Exertional dyspnea Since 2006   a) 8/'11 Myoview: No Ischemic/Infarct; Echo: Nl LV size & fxn, EF 55-60%, mod-Severe LA & RA dilation,mild AoV sclerosis, Mild PHTN;; 4/'14 Echo: LV EF  55-60%, aortic sclerosis, moderate MR, moderate to severe LA and RA dilation, increased RV pressure -mild PHtn; 9/'15: Lexiscan Myovew: No Ischmia or Infarct, Afib - no EF.  [name]. Hyperlipidemia with target LDL less than 70   . Mild mitral regurgitation by prior echocardiography 03/2013   Echo 2019:  Mild MR  . 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 CARDIAC CATHETERIZATION  2007   CONCLUSION SIGNIFICANT THREE VESSEL CAD  2.NORMAL LEFT VENTRICULAR SYSTOLIC FUNCTION  . CORONARY ARTERY BYPASS GRAFT  2008   x4  . NM MYOVIEW LTD  September 2015   Non-gated. Low risk. No ischemia or infarct  . TRANSTHORACIC ECHOCARDIOGRAM  04/02/2013   EF 55-60%. No regional W. May. Moderate MR, moderate to severely dilated LA. Mild RV pressure increase with moderate RA dilation. Moderate TR.  [name]. TRANSTHORACIC ECHOCARDIOGRAM  05/2018   Normal LV size.  Moderate LVH.  EF 55-60% with no regional wall motion abnormality.  Unable to assess diastolic function.  Severe biatrial enlargement.  Mild MR   Prior to Admission medications   Medication Sig Start Date End Date Taking? Authorizing Provider  allopurinol (ZYLOPRIM) 100 MG tablet Take 1 tablet by mouth daily as needed (for gout).  07/19/13  Yes [provider]  fenofibrate 160 MG tablet Take 1 tablet by mouth daily. 06/01/13  Yes  [provider]  furosemide (LASIX) 40 MG tablet TAKE 1 TABLET(40 MG) BY MOUTH DAILY 06/03/18  Yes Marykay Lex, MD  glipiZIDE (GLUCOTROL XL) 10 MG 24 hr tablet Take by mouth daily with breakfast.  07/30/14  Yes [provider]  indomethacin (INDOCIN SR) 75 MG CR capsule Take 75 mg by mouth 2 (two) times daily as needed for mild pain (gout symptoms).    Yes [provider]  JANUMET XR [id number] MG TB24 Take 1 tablet by mouth daily. 03/27/16  Yes [provider]  metoprolol tartrate (LOPRESSOR) 25 MG tablet Take 0.5 tablets (12.5 mg total) by mouth 2 (two) times daily. 06/03/18  Yes Marykay Lex, MD  potassium chloride (K-DUR,KLOR-CON) 10 MEQ tablet Take 1 tablet (10 mEq total) by mouth daily. 06/03/18  Yes Marykay Lex, MD  simvastatin (ZOCOR) 40 MG tablet TAKE 1 TABLET(40 MG) BY MOUTH AT BEDTIME 05/31/17  Yes Marykay Lex, MD  warfarin (COUMADIN) 5 MG tablet Per INR 05/16/13  Yes [provider]  LANCETS SUPER THIN 28G MISC  01/26/14   [provider]  TRUETEST TEST test strip Inject 1 strip into the skin daily. Use 1 test strip each to check glucose daily 03/09/15   [provider]    Scheduled Meds: Continuous Infusions: . sodium chloride    . sodium chloride    . insulin (NOVOLIN-R) infusion    . lactated ringers 1,000 mL (07/08/18 0930)  . lactated ringers 125 mL/hr at 07/08/18 0823  . pantoprazole (PROTONIX) IV    . phytonadione (VITAMIN K) IV    . potassium chloride     PRN Meds:.  Allergies as of 07/08/2018 - Review Complete 07/08/2018  Allergen Reaction Noted  . Niaspan [niacin er]  07/31/2013   Family History  Problem Relation Age of Onset  . Stroke Mother   . Cancer Father    Social History   Socioeconomic History  . Marital status: Single    Spouse name: Not on file  . Number of children: Not on file  . Years of education: Not on file  . Highest education level: Not on file  Occupational History   . Not on file  Social Needs  . Financial resource strain: Not on file  . Food insecurity:    Worry: Not on file    Inability: Not on file  . Transportation needs:    Medical: Not on file    Non-medical: Not on file  Tobacco Use  . Smoking status: Former Smoker    Packs/day: 2.00    Years: 55.00    Pack years: 110.00    Last attempt to quit: 07/29/2007    Years since quitting: 10.9  . Smokeless tobacco: Never Used  Substance and Sexual Activity  . Alcohol use: Yes    Alcohol/week: 16.8 oz    Types: 28 Cans of beer per week  . Drug use: No  . Sexual activity: Not on file  Lifestyle  . Physical activity:    Days per  week: Not on file    Minutes per session: Not on file  . Stress: Not on file  Relationships  . Social connections:    Talks on phone: Not on file    Gets together: Not on file    Attends religious service: Not on file    Active member of club or organization: Not on file    Attends meetings of clubs or organizations: Not on file    Relationship status: Not on file  . Intimate partner violence:    Fear of current or ex partner: Not on file    Emotionally abused: Not on file    Physically abused: Not on file    Forced sexual activity: Not on file  Other Topics Concern  . Not on file  Social History Narrative   He is a divorced father 3, grandfather of 3. He enjoys walking on the treadmill for roughly 30 minutes at a time daily. No symptoms of that. He also while systolic daily. He doesn't drink alcohol and quit smoking years ago.   Review of Systems: All negative except as stated above in HPI.  Physical Exam: Vital signs: Vitals:   07/08/18 0900 07/08/18 0915  BP: (!) 114/58 93/63  Resp: (!) 21 19  Temp:       General: Well nourished male, seems tired HENT: Normocephalic, atraumatic, moist mucus membranes Pulm: Good air movement with no wheezing or crackles  CV: Tachycardic with irregular rhythm, no obvious murmurs or rubs heard Abdomen: Active  bowel sounds, soft, non-distended, no tenderness to palpation  Rectal: Melanotic stool, no external hemorrhoids  Extremities: Pulses palpable in all extremities, no LE edema  Skin: Warm and dry, ecchymosis on the left and right UEs  Neuro: Alert and oriented x 3  GI:  Lab Results: Recent Labs    07/08/18 0729  WBC 26.0*  HGB 7.9*  HCT 25.0*  PLT 448*   BMET Recent Labs    07/08/18 0729  NA 131*  K 4.0  CL 96*  CO2 12*  GLUCOSE 297*  BUN 101*  CREATININE 3.02*  CALCIUM 8.3*   LFT No results for input(s): PROT, ALBUMIN, AST, ALT, ALKPHOS, BILITOT, BILIDIR, IBILI in the last 72 hours. PT/INR Recent Labs    07/08/18 0820  LABPROT >90.0*  INR >10.00*   Studies/Results: Dg Ribs Unilateral W/chest Left  Result Date: 07/08/2018 CLINICAL DATA:  Multiple falls over the last week, left lower rib pain EXAM: LEFT RIBS AND CHEST - 3+ VIEW COMPARISON:  Chest x-ray of 02/23/2014 FINDINGS: No active infiltrate or effusion is seen. The lungs are slightly hyperaerated. Mediastinal and hilar contours are unremarkable. Median sternotomy sutures are noted from prior CABG. The heart size is stable. Left rib detail films show nondisplaced fractures of the very anterior left eighth and ninth ribs. IMPRESSION: 1. Nondisplaced fractures of the anterior left eighth and ninth ribs. 2. No active lung disease. Electronically Signed   By: Dwyane Dee M.D.   On: 07/08/2018 08:37   Ct Head Wo Contrast  Result Date: 07/08/2018 CLINICAL DATA:  Fall, headache, neck pain EXAM: CT HEAD WITHOUT CONTRAST CT CERVICAL SPINE WITHOUT CONTRAST TECHNIQUE: Multidetector CT imaging of the head and cervical spine was performed following the standard protocol without intravenous contrast. Multiplanar CT image reconstructions of the cervical spine were also generated. COMPARISON:  None. FINDINGS: CT HEAD FINDINGS Brain: Diffuse cerebral atrophy. No acute intracranial abnormality. Specifically, no hemorrhage, hydrocephalus,  mass lesion, acute infarction, or significant intracranial injury. Vascular:  No hyperdense vessel or unexpected calcification. Skull: No acute calvarial abnormality. Sinuses/Orbits: Visualized paranasal sinuses and mastoids clear. Orbital soft tissues unremarkable. Other: None CT CERVICAL SPINE FINDINGS Alignment: No subluxation Skull base and vertebrae: No acute fracture. No primary bone lesion or focal pathologic process. Soft tissues and spinal canal: No prevertebral fluid or swelling. No visible canal hematoma. Disc levels: Disc space narrowing and spurring from C4-5 through C6-7. Upper chest: Biapical scarring Other: No acute findings.  Carotid artery calcifications. IMPRESSION: Diffuse cerebral atrophy.  No acute intracranial abnormality. 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 No acute bony abnormality. Electronically Signed   By: Charlett Nose M.D.   On: 07/08/2018 08:11   Ct Cervical Spine Wo Contrast  Result Date: 07/08/2018 CLINICAL DATA:  Fall, headache, neck pain EXAM: CT HEAD WITHOUT CONTRAST CT CERVICAL SPINE WITHOUT CONTRAST TECHNIQUE: Multidetector CT imaging of the head and cervical spine was performed following the standard protocol without intravenous contrast. Multiplanar CT image reconstructions of the cervical spine were also generated. COMPARISON:  None. FINDINGS: CT HEAD FINDINGS Brain: Diffuse cerebral atrophy. No acute intracranial abnormality. Specifically, no hemorrhage, hydrocephalus, mass lesion, acute infarction, or significant intracranial injury. Vascular: No hyperdense vessel or unexpected calcification. Skull: No acute calvarial abnormality. Sinuses/Orbits: Visualized paranasal sinuses and mastoids clear. Orbital soft tissues unremarkable. Other: None CT CERVICAL SPINE FINDINGS Alignment: No subluxation Skull base and vertebrae: No acute fracture. No primary bone lesion or focal pathologic process. Soft tissues and spinal canal: No prevertebral fluid or  swelling. No visible canal hematoma. Disc levels: Disc space narrowing and spurring from C4-5 through C6-7. Upper chest: Biapical scarring Other: No acute findings.  Carotid artery calcifications. IMPRESSION: Diffuse cerebral atrophy.  No acute intracranial abnormality. 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 No acute bony abnormality. Electronically Signed   By: Charlett Nose M.D.   On: 07/08/2018 08:11   Impression/Plan:   Eric Calderon is a 77 y.o. male with atrial fibrillation on warfarin who presented to the ED with symptomatic anemia secondary to a supratherapuetic INR and probable upper GI bleed.   Symptomatic Anemia GI Bleed, probable upper  Supratherapeutic INR secondary to Indomethacin use - BUN 101 and Creatinine 3.02 (baseline creatinine around 1.6 per care everywhere) - INR > 10, holding warfarin, given FFP and IV vitamin K in the ED - Started on PPI therapy  - No evidence of cirrhosis on labs or PE, will check LFTs - Maintain 2 large bore IVs  - Trend CBC - Will need endoscopy once INR is lower   Atrial Fibrillation  - CHADsVASC2 5  - Supratherapeutic INR, holding warfarin   AKI on CKD - Prerenal etiology  - Baseline creatinine around 1.6  Will discuss the case further with Dr. Ewing Schlein.    LOS: 0 days   Levora Dredge  MD 07/08/2018, 10:03 AM

## 2018-07-09 ENCOUNTER — Encounter (HOSPITAL_COMMUNITY): Payer: Self-pay | Admitting: *Deleted

## 2018-07-09 ENCOUNTER — Inpatient Hospital Stay (HOSPITAL_COMMUNITY): Payer: Medicare Other | Admitting: Anesthesiology

## 2018-07-09 ENCOUNTER — Encounter (HOSPITAL_COMMUNITY)
Admission: EM | Disposition: A | Discharge status: Skilled Nursing Facility | Payer: Self-pay | Source: Home / Self Care | Attending: Family Medicine

## 2018-07-09 DIAGNOSIS — E872 Acidosis: Secondary | ICD-10-CM

## 2018-07-09 DIAGNOSIS — I482 Chronic atrial fibrillation: Secondary | ICD-10-CM

## 2018-07-09 DIAGNOSIS — R791 Abnormal coagulation profile: Secondary | ICD-10-CM

## 2018-07-09 DIAGNOSIS — E785 Hyperlipidemia, unspecified: Secondary | ICD-10-CM

## 2018-07-09 DIAGNOSIS — I25119 Atherosclerotic heart disease of native coronary artery with unspecified angina pectoris: Secondary | ICD-10-CM

## 2018-07-09 DIAGNOSIS — D72829 Elevated white blood cell count, unspecified: Secondary | ICD-10-CM

## 2018-07-09 DIAGNOSIS — R748 Abnormal levels of other serum enzymes: Secondary | ICD-10-CM

## 2018-07-09 DIAGNOSIS — K922 Gastrointestinal hemorrhage, unspecified: Secondary | ICD-10-CM

## 2018-07-09 DIAGNOSIS — N179 Acute kidney failure, unspecified: Secondary | ICD-10-CM

## 2018-07-09 DIAGNOSIS — E119 Type 2 diabetes mellitus without complications: Secondary | ICD-10-CM

## 2018-07-09 DIAGNOSIS — Z7901 Long term (current) use of anticoagulants: Secondary | ICD-10-CM

## 2018-07-09 DIAGNOSIS — I4891 Unspecified atrial fibrillation: Secondary | ICD-10-CM

## 2018-07-09 DIAGNOSIS — I10 Essential (primary) hypertension: Secondary | ICD-10-CM

## 2018-07-09 HISTORY — PX: ESOPHAGOGASTRODUODENOSCOPY (EGD) WITH PROPOFOL: SHX5813

## 2018-07-09 LAB — CBC
HCT: 24.3 % — ABNORMAL LOW (ref 39.0–52.0)
Hemoglobin: 8.3 g/dL — ABNORMAL LOW (ref 13.0–17.0)
MCH: 29.5 pg (ref 26.0–34.0)
MCHC: 34.2 g/dL (ref 30.0–36.0)
MCV: 86.5 fL (ref 78.0–100.0)
Platelets: 328 10*3/uL (ref 150–400)
RBC: 2.81 MIL/uL — ABNORMAL LOW (ref 4.22–5.81)
RDW: 14.3 % (ref 11.5–15.5)
WBC: 17.8 10*3/uL — ABNORMAL HIGH (ref 4.0–10.5)

## 2018-07-09 LAB — GLUCOSE, CAPILLARY
GLUCOSE-CAPILLARY: 104 mg/dL — AB (ref 70–99)
GLUCOSE-CAPILLARY: 95 mg/dL (ref 70–99)
Glucose-Capillary: 125 mg/dL — ABNORMAL HIGH (ref 70–99)
Glucose-Capillary: 100 mg/dL — ABNORMAL HIGH (ref 70–99)
Glucose-Capillary: 290 mg/dL — ABNORMAL HIGH (ref 70–99)
Glucose-Capillary: 113 mg/dL — ABNORMAL HIGH (ref 70–99)

## 2018-07-09 LAB — BASIC METABOLIC PANEL
ANION GAP: 7 (ref 5–15)
Anion gap: 9 (ref 5–15)
BUN: 77 mg/dL — ABNORMAL HIGH (ref 8–23)
BUN: 62 mg/dL — ABNORMAL HIGH (ref 8–23)
CHLORIDE: 103 mmol/L (ref 98–111)
CO2: 21 mmol/L — ABNORMAL LOW (ref 22–32)
CO2: 24 mmol/L (ref 22–32)
Calcium: 7.8 mg/dL — ABNORMAL LOW (ref 8.9–10.3)
Calcium: 7.8 mg/dL — ABNORMAL LOW (ref 8.9–10.3)
Chloride: 109 mmol/L (ref 98–111)
Creatinine, Ser: 1.72 mg/dL — ABNORMAL HIGH (ref 0.61–1.24)
Creatinine, Ser: 1.5 mg/dL — ABNORMAL HIGH (ref 0.61–1.24)
GFR calc Af Amer: 50 mL/min — ABNORMAL LOW (ref >60)
GFR calc non Af Amer: 37 mL/min — ABNORMAL LOW (ref >60)
GFR calc non Af Amer: 43 mL/min — ABNORMAL LOW (ref >60)
GFR, EST AFRICAN AMERICAN: 43 mL/min — AB (ref >60)
Glucose, Bld: 137 mg/dL — ABNORMAL HIGH (ref 70–99)
Glucose, Bld: 128 mg/dL — ABNORMAL HIGH (ref 70–99)
Potassium: 3.2 mmol/L — ABNORMAL LOW (ref 3.5–5.1)
Potassium: 3.3 mmol/L — ABNORMAL LOW (ref 3.5–5.1)
SODIUM: 134 mmol/L — AB (ref 135–145)
Sodium: 139 mmol/L (ref 135–145)

## 2018-07-09 LAB — BPAM FFP
BLOOD PRODUCT EXPIRATION DATE: 201907212359
BLOOD PRODUCT EXPIRATION DATE: 201907212359
ISSUE DATE / TIME: 201907161144
ISSUE DATE / TIME: 201907161608
UNIT TYPE AND RH: 7300
Unit Type and Rh: 7300

## 2018-07-09 LAB — PREPARE FRESH FROZEN PLASMA: UNIT DIVISION: 0

## 2018-07-09 LAB — TROPONIN I
Troponin I: 0.43 ng/mL (ref <0.03)
Troponin I: 0.25 ng/mL (ref <0.03)

## 2018-07-09 LAB — PROTIME-INR
INR: 1.37
Prothrombin Time: 16.8 seconds — ABNORMAL HIGH (ref 11.4–15.2)

## 2018-07-09 SURGERY — ESOPHAGOGASTRODUODENOSCOPY (EGD) WITH PROPOFOL
Anesthesia: Monitor Anesthesia Care

## 2018-07-09 MED ORDER — METOPROLOL TARTRATE 12.5 MG HALF TABLET
12.5000 mg | ORAL_TABLET | Freq: Two times a day (BID) | ORAL | Status: DC
  Administered 2018-07-09 – 2018-07-12 (×7): 12.5 mg via ORAL
  Filled 2018-07-09 (×7): qty 1

## 2018-07-09 MED ORDER — PROPOFOL 10 MG/ML IV BOLUS
INTRAVENOUS | Status: DC | PRN
  Administered 2018-07-09: 70 mg via INTRAVENOUS

## 2018-07-09 MED ORDER — SIMVASTATIN 40 MG PO TABS
40.0000 mg | ORAL_TABLET | Freq: Every day | ORAL | Status: DC
  Administered 2018-07-09 – 2018-07-11 (×3): 40 mg via ORAL
  Filled 2018-07-09 (×3): qty 1

## 2018-07-09 MED ORDER — INSULIN ASPART 100 UNIT/ML ~~LOC~~ SOLN
0.0000 [IU] | Freq: Three times a day (TID) | SUBCUTANEOUS | Status: DC
  Administered 2018-07-09: 5 [IU] via SUBCUTANEOUS
  Administered 2018-07-10: 1 [IU] via SUBCUTANEOUS
  Administered 2018-07-10: 2 [IU] via SUBCUTANEOUS
  Administered 2018-07-11: 5 [IU] via SUBCUTANEOUS
  Administered 2018-07-12: 1 [IU] via SUBCUTANEOUS

## 2018-07-09 MED ORDER — SODIUM CHLORIDE 0.9 % IV SOLN: INTRAVENOUS | Status: DC

## 2018-07-09 MED ORDER — CEFTRIAXONE SODIUM 1 G IJ SOLR
1.0000 g | INTRAMUSCULAR | Status: AC
  Administered 2018-07-09 – 2018-07-11 (×3): 1 g via INTRAVENOUS
  Filled 2018-07-09 (×3): qty 10

## 2018-07-09 MED ORDER — SODIUM CHLORIDE 0.9 % IV SOLN
INTRAVENOUS | Status: DC
  Administered 2018-07-09 – 2018-07-10 (×2): via INTRAVENOUS

## 2018-07-09 MED ORDER — SODIUM CHLORIDE 0.9 % IV BOLUS
500.0000 mL | Freq: Once | INTRAVENOUS | Status: AC
  Administered 2018-07-09: 500 mL via INTRAVENOUS

## 2018-07-09 MED ORDER — POTASSIUM CHLORIDE CRYS ER 20 MEQ PO TBCR
20.0000 meq | EXTENDED_RELEASE_TABLET | Freq: Once | ORAL | Status: AC
  Administered 2018-07-09: 20 meq via ORAL
  Filled 2018-07-09: qty 1

## 2018-07-09 MED ORDER — SODIUM CHLORIDE 0.9 % IV SOLN: 1.0000 g | INTRAVENOUS | Status: DC

## 2018-07-09 MED ORDER — ALLOPURINOL 100 MG PO TABS: 100.0000 mg | ORAL_TABLET | Freq: Every day | ORAL | Status: DC | PRN

## 2018-07-09 MED ORDER — INSULIN ASPART 100 UNIT/ML ~~LOC~~ SOLN: 0.0000 [IU] | Freq: Every day | SUBCUTANEOUS | Status: DC

## 2018-07-09 SURGICAL SUPPLY — 15 items

## 2018-07-09 NOTE — Transfer of Care (Signed)
Immediate Anesthesia Transfer of Care Note  Patient: Eric Calderon  Procedure(s) Performed: ESOPHAGOGASTRODUODENOSCOPY (EGD) WITH PROPOFOL (N/A )  Patient Location: PACU  Anesthesia Type:MAC  Level of Consciousness: awake, alert  and patient cooperative  Airway & Oxygen Therapy: Patient Spontanous Breathing and Patient connected to nasal cannula oxygen  Post-op Assessment: Report given to RN, Post -op Vital signs reviewed and stable, Patient moving all extremities and Patient moving all extremities X 4  Post vital signs: Reviewed and stable  Last Vitals:  Vitals Value Taken Time  BP 82/42 07/09/2018  1:46 PM  Temp    Pulse 110 07/09/2018  1:46 PM  Resp 23 07/09/2018  1:46 PM  SpO2 100 % 07/09/2018  1:46 PM  Vitals shown include unvalidated device data.  Last Pain:  Vitals:   07/09/18 1346  TempSrc:   PainSc: 0-No pain         Complications: No apparent anesthesia complications

## 2018-07-09 NOTE — Progress Notes (Signed)
Hypoglycemic Event  CBG:  62 @ 2349  Treatment:  2 containers of orange juice  Symptoms: None  Follow-up CBG: Time:  0031 CBG Result:  95  Possible Reasons for Event: Other:  Possible overcorrection with previous dose of insulin  Comments/MD notified:  Merdis DelayK. Schorr; NP notified.  No additional orders received.      Laverta BaltimoreMichael D Verlisa Vara

## 2018-07-09 NOTE — Progress Notes (Signed)
Results for Eric Calderon, Eric Calderon (MRN [id number]) as of 07/09/2018 14:55  Ref. Range 07/08/2018 23:49 07/09/2018 00:31 07/09/2018 03:50 07/09/2018 07:49 07/09/2018 12:22  Glucose-Capillary Latest Ref Range: 70 - 99 mg/dL 62 (L) 95 409104 (H) 811125 (H) 100 (H)  Noted that patient is on CBGs every 4 hours along with Novolog SENSITIVE correction scale every 4 hours. When patient is eating, recommend changing Novolog SENSITIVE correction scale to TID & HS scale.   Smith MinceKendra Astin Rape RN BSN CDE Diabetes Coordinator Pager: [phone]  8am-5pm

## 2018-07-09 NOTE — Progress Notes (Signed)
Pt noted to have manual BP of 92/44 at 0002, checked manually.  Paged [facility] provider Merdis DelayK. Schorr, who order 1 500cc bolus of normal saline.  Bolus given.  After administration of bolus, manual BP noted to be 90/42 at 0149.  Again paged provider K. Schorr, who ordered an additional 500cc bolus of normal saline.  Bolus given.  After administration of second bolus, BP noted to be 104/60 at 0351.  Will continue to monitor.

## 2018-07-09 NOTE — Anesthesia Preprocedure Evaluation (Signed)
Anesthesia Evaluation  Patient identified by MRN, date of birth, ID band Patient awake    Reviewed: Allergy & Precautions, NPO status , Patient's Chart, lab work & pertinent test results  Airway Mallampati: II  TM Distance: >3 FB Neck ROM: Full    Dental no notable dental hx.    Pulmonary neg pulmonary ROS, former smoker,    Pulmonary exam normal breath sounds clear to auscultation       Cardiovascular hypertension, + CAD, + Cardiac Stents and + CABG  + dysrhythmias Atrial Fibrillation  Rhythm:Irregular Rate:Normal     Neuro/Psych negative neurological ROS  negative psych ROS   GI/Hepatic negative GI ROS, Neg liver ROS,   Endo/Other  diabetes  Renal/GU negative Renal ROS  negative genitourinary   Musculoskeletal negative musculoskeletal ROS (+)   Abdominal   Peds negative pediatric ROS (+)  Hematology negative hematology ROS (+) anemia ,   Anesthesia Other Findings   Reproductive/Obstetrics negative OB ROS                             Anesthesia Physical Anesthesia Plan  ASA: III  Anesthesia Plan: MAC   Post-op Pain Management:    Induction: Intravenous  PONV Risk Score and Plan: 0  Airway Management Planned: Simple Face Mask  Additional Equipment:   Intra-op Plan:   Post-operative Plan:   Informed Consent: I have reviewed the patients History and Physical, chart, labs and discussed the procedure including the risks, benefits and alternatives for the proposed anesthesia with the patient or authorized representative who has indicated his/her understanding and acceptance.   Dental advisory given  Plan Discussed with: CRNA and Surgeon  Anesthesia Plan Comments:         Anesthesia Quick Evaluation

## 2018-07-09 NOTE — Progress Notes (Addendum)
PROGRESS NOTE  Eric Calderon  EAV:[id number] DOB: 05/30/1941 DOA: 07/08/2018 PCP: Marva Panda, NP   Brief Narrative: Eric Calderon is a 77 y.o. male with a Past Medical History of CAD s/p CABG; HLD; HTN; HLD; COPD; DM; and afib on Coumadin who presents with a fall.  Over the last 10 days, he has had progressively decreased appetite with dizziness and falls.  Things worsened Saturday and since then he has also been seeing tarry stools.  Of note, he has always refused colonoscopy and stool testing for blood at his PCP and continues to refuse C-scope; he is willing to have an EGD.  He is DNR.  On exam, he is somewhat frail and ill-appearing.  His skin is sallow.  Exam is otherwise unremarkable.  Labs notable for supratherapeutic INR >10; creatinine 2.62 (baseline 1.64 in 5/19) with BUN 99 (prior 23); lactate 4.98; troponin 0.05, 0.25, Hgb 7.9  Assessment & Plan: Principal Problem:   Symptomatic anemia Active Problems:   Hyperlipidemia with target LDL less than 70   Essential hypertension   Chronic atrial fibrillation (HCC); CHA2DS2-VASc Score - 4   Long term current use of anticoagulant therapy   3-vessel CAD - 70% ISR and circumflex, diffuse 87 the RCA (nondominant), severe bifurcation LAD diagonal lesion 80 and 60% --> sent for CABG x4   Atrial fibrillation with rapid ventricular response (HCC)   Supratherapeutic INR   Diabetes mellitus without complication (HCC)   COPD (chronic obstructive pulmonary disease) (HCC)   Elevated troponin   Rib fracture   GI bleeding   Metabolic acidosis   Acute kidney injury (HCC)   Leukocytosis  Symptomatic acute blood loss anemia due to upper GI bleed: With minimal clots in stomach on EGD 7/17 showing no signs of active bleeding. Barrett's esophagus noted.  - Continue PPI - Monitor CBC daily, hgb 7.9 on admission up to 8.8 > 8.3 s/p 2u PBCs 7/16. - No mention of timing on colonoscopy by GI. Will give soft diet per GI  Supratherapeutic INR:  Possibly due to poor po intake - Will restart anticoagulation/coumadin 7/18 or per GI. s/p reversal with FFP, vitamin K.   Chronic AFib with RVR: Ventricular response more controlled now. Soft BPs limiting medication titration.  - Metoprolol restarted with parameters to hold for MAP <70 - Holding anticoagulation as above - Avoid nephrotoxins. If CrCl remains >60ml/min could transition to eliquis  AKI on stage III CKD: Prerenal azotemia due to dehydration, poor per oral intake and continued use of diuretic. Not clear what led to poor per oral intake.  - Cr improving. Continue monitoring. - Note outpatient weights have been trending downward, last was 156lbs in June not 144lbs. Suspect he is still dry. Will continue IVF's. - Dietitian consulted  Troponin elevation due to demand ischemia in pt with CAD s/p PCI then CABG 2008: In setting of anemia and rapid ventricular response to AFib. ECG with likely rate-related repol abnormality. Troponin trending downward. No chest pain. 2D Echo May 28, 2018: Normal LV size.  Moderate LVH.  EF 55-60% with no regional wall motion abnormality.  Unable to assess diastolic function.  Severe biatrial enlargement.  Mild MR. - Repeat ECG. No interventions planned unless CP develops.   Hypokalemia: Will supplement, aiming >4. Mg high yesterday.  - Check BMP, Mg in AM  Falls at home: CT head, C spine without bleeding/dislocation/fracture. Left 8th, 9th ribs fractured, nondisplaced.  - PT ordered - Pain control  HTN: Currently soft BP - Continue  metoprolol  Demand ischemia in the setting of afib with RVR -Elevated troponin, likely related to demand ischemia -Will trend -Currently low suspicion for ACS -His rate control has improved and this is likely to lead to normalization in troponin; will trend  Supratherapeutic INR -INR >10 -Reversing INR with FFP, vitamin K, following INR  Leukocytosis: Improving. Significant pyuria and bacteriuria on urinalysis.    - No urine culture ever sent from ED. Will continue CTX x3 days.   T2DM: HbA1c 7.4%.  - Holding oral medications Janumet, glipizide - SSI  Hyperlipidemia:  - Continue statin  Gout: No flare - Continue allopurinol  DVT prophylaxis: SCDs Code Status: DNR Family Communication: None at bedside Disposition Plan: TBD  Consultants:   [facility]  Procedures:   EGD 07/09/2018 by Dr. Ewing Schlein: - Normal larynx. - Small hiatal hernia. with questionable very short segment Barrett's not biopsied - minimal Clotted blood in the stomach. - Normal duodenal bulb, first portion of the duodenum and second portion of the duodenum. - The examination was otherwise normal. - No specimens collected.  - Patient has a contact number available for emergencies. The signs and symptoms of potential delayed complications were discussed with the patient. Return to normal activities tomorrow. Written discharge instructions were provided to the patient. - Clear liquid diet today. - Continue present medications. - Return to GI clinic PRN. - Telephone GI clinic if symptomatic PRN. - Perform a colonoscopy at appointment to be scheduled. either tomorrow or as outpatient and will discussed with patient although no reason to think that bleeding source was the colon with a few blood clots seen in the stomach  Antimicrobials:  Ceftriaxone 7/17/ - 7/19   Subjective: No further dark stools. Denies abd pain, N/V/D, chest pain, dyspnea, or palpitations.   Objective: Vitals:   07/09/18 1346 07/09/18 1350 07/09/18 1355 07/09/18 1400  BP: (!) 93/46 (!) 91/43 (!) 100/43 (!) 95/59  Pulse: (!) 112 (!) 108 (!) 111 (!) 119  Resp: (!) 23 18 17 18   Temp: 97.9 F (36.6 C)     TempSrc: Oral     SpO2: 100% 100% 100% 100%  Weight:      Height:        Intake/Output Summary (Last 24 hours) at 07/09/2018 1439 Last data filed at 07/09/2018 1357 Gross per 24 hour  Intake 1595.33 ml  Output 2520 ml  Net -924.67 ml    Filed Weights   07/08/18 2256 07/09/18 0515  Weight: 65.1 kg (143 lb 8.3 oz) 65.7 kg (144 lb 13.5 oz)    Gen: Frail elderly male in no distress Pulm: Non-labored breathing room air. Clear to auscultation bilaterally.  CV: Rapid irreg. Soft holosystolic apical murmur. No JVD or significant LE edema. GI: Abdomen soft, non-tender, non-distended, with normoactive bowel sounds. No organomegaly or masses felt. Ext: Warm, no deformities Skin: No rashes, lesions or ulcers Neuro: Alert and oriented. No focal neurological deficits. Psych: Judgement and insight appear normal. Mood & affect appropriate.   Data Reviewed: I have personally reviewed following labs and imaging studies  CBC: Recent Labs  Lab 07/08/18 0729 07/08/18 2121 07/09/18 0920  WBC 26.0* 19.4* 17.8*  HGB 7.9* 8.8* 8.3*  HCT 25.0* 25.6* 24.3*  MCV 92.6 86.5 86.5  PLT 448* 334 328   Basic Metabolic Panel: Recent Labs  Lab 07/08/18 0729 07/08/18 1031 07/08/18 1502 07/08/18 2121 07/09/18 0057 07/09/18 0920  NA 131* 132* 135 136 134* 139  K 4.0 3.8 3.2* 3.2* 3.2* 3.3*  CL  96* 99 102 103 103 109  CO2 12* 18* 21* 25 24 21*  GLUCOSE 297* 263* 235* 107* 137* 128*  BUN 101* 99* 96* 83* 77* 62*  CREATININE 3.02* 2.62* 2.18* 1.86* 1.72* 1.50*  CALCIUM 8.3* 7.9* 8.1* 8.2* 7.8* 7.8*  MG 2.5*  --   --   --   --   --    GFR: Estimated Creatinine Clearance: 38.9 mL/min (A) (by C-G formula based on SCr of 1.5 mg/dL (H)). Liver Function Tests: Recent Labs  Lab 07/08/18 1031  AST 50*  ALT 20  ALKPHOS 36*  BILITOT 1.2  PROT 4.7*  ALBUMIN 1.9*   No results for input(s): LIPASE, AMYLASE in the last 168 hours. No results for input(s): AMMONIA in the last 168 hours. Coagulation Profile: Recent Labs  Lab 07/08/18 0820 07/08/18 2121 07/09/18 0920  INR >10.00* 1.52 1.37   Cardiac Enzymes: Recent Labs  Lab 07/08/18 0729 07/08/18 1502 07/08/18 2121 07/09/18 0346 07/09/18 0920  TROPONINI 0.05* 0.25* 0.58*  0.43* 0.25*   BNP (last 3 results) No results for input(s): PROBNP in the last 8760 hours. HbA1C: Recent Labs    07/08/18 0729  HGBA1C 7.4*   CBG: Recent Labs  Lab 07/08/18 2349 07/09/18 0031 07/09/18 0350 07/09/18 0749 07/09/18 1222  GLUCAP 62* 95 104* 125* 100*   Lipid Profile: No results for input(s): CHOL, HDL, LDLCALC, TRIG, CHOLHDL, LDLDIRECT in the last 72 hours. Thyroid Function Tests: Recent Labs    07/08/18 0730  TSH 3.235   Anemia Panel: No results for input(s): VITAMINB12, FOLATE, FERRITIN, TIBC, IRON, RETICCTPCT in the last 72 hours. Urine analysis:    Component Value Date/Time   COLORURINE YELLOW 07/08/2018 1031   APPEARANCEUR HAZY (A) 07/08/2018 1031   LABSPEC 1.009 07/08/2018 1031   PHURINE 5.0 07/08/2018 1031   GLUCOSEU >=500 (A) 07/08/2018 1031   HGBUR LARGE (A) 07/08/2018 1031   BILIRUBINUR NEGATIVE 07/08/2018 1031   KETONESUR NEGATIVE 07/08/2018 1031   PROTEINUR NEGATIVE 07/08/2018 1031   NITRITE NEGATIVE 07/08/2018 1031   LEUKOCYTESUR LARGE (A) 07/08/2018 1031   Recent Results (from the past 240 hour(s))  MRSA PCR Screening     Status: None   Collection Time: 07/08/18  6:10 PM  Result Value Ref Range Status   MRSA by PCR NEGATIVE NEGATIVE Final    Comment:        The GeneXpert MRSA Assay (FDA approved for NASAL specimens only), is one component of a comprehensive MRSA colonization surveillance program. It is not intended to diagnose MRSA infection nor to guide or monitor treatment for MRSA infections. Performed at [facility], [address], [place], [place] [address]       Radiology Studies: Dg Ribs Unilateral W/chest Left  Result Date: 07/08/2018 CLINICAL DATA:  Multiple falls over the last week, left lower rib pain EXAM: LEFT RIBS AND CHEST - 3+ VIEW COMPARISON:  Chest x-ray of 02/23/2014 FINDINGS: No active infiltrate or effusion is seen. The lungs are slightly hyperaerated. Mediastinal and hilar contours are  unremarkable. Median sternotomy sutures are noted from prior CABG. The heart size is stable. Left rib detail films show nondisplaced fractures of the very anterior left eighth and ninth ribs. IMPRESSION: 1. Nondisplaced fractures of the anterior left eighth and ninth ribs. 2. No active lung disease. Electronically Signed   By: Dwyane Dee M.D.   On: 07/08/2018 08:37   Ct Head Wo Contrast  Result Date: 07/08/2018 CLINICAL DATA:  Fall, headache, neck pain EXAM:  CT HEAD WITHOUT CONTRAST CT CERVICAL SPINE WITHOUT CONTRAST TECHNIQUE: Multidetector CT imaging of the head and cervical spine was performed following the standard protocol without intravenous contrast. Multiplanar CT image reconstructions of the cervical spine were also generated. COMPARISON:  None. FINDINGS: CT HEAD FINDINGS Brain: Diffuse cerebral atrophy. No acute intracranial abnormality. Specifically, no hemorrhage, hydrocephalus, mass lesion, acute infarction, or significant intracranial injury. Vascular: No hyperdense vessel or unexpected calcification. Skull: No acute calvarial abnormality. Sinuses/Orbits: Visualized paranasal sinuses and mastoids clear. Orbital soft tissues unremarkable. Other: None CT CERVICAL SPINE FINDINGS Alignment: No subluxation Skull base and vertebrae: No acute fracture. No primary bone lesion or focal pathologic process. Soft tissues and spinal canal: No prevertebral fluid or swelling. No visible canal hematoma. Disc levels: Disc space narrowing and spurring from C4-5 through C6-7. Upper chest: Biapical scarring Other: No acute findings.  Carotid artery calcifications. IMPRESSION: Diffuse cerebral atrophy.  No acute intracranial abnormality. 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 No acute bony abnormality. Electronically Signed   By: Charlett NoseKevin  Dover M.D.   On: 07/08/2018 08:11   Ct Cervical Spine Wo Contrast  Result Date: 07/08/2018 CLINICAL DATA:  Fall, headache, neck pain EXAM: CT HEAD WITHOUT  CONTRAST CT CERVICAL SPINE WITHOUT CONTRAST TECHNIQUE: Multidetector CT imaging of the head and cervical spine was performed following the standard protocol without intravenous contrast. Multiplanar CT image reconstructions of the cervical spine were also generated. COMPARISON:  None. FINDINGS: CT HEAD FINDINGS Brain: Diffuse cerebral atrophy. No acute intracranial abnormality. Specifically, no hemorrhage, hydrocephalus, mass lesion, acute infarction, or significant intracranial injury. Vascular: No hyperdense vessel or unexpected calcification. Skull: No acute calvarial abnormality. Sinuses/Orbits: Visualized paranasal sinuses and mastoids clear. Orbital soft tissues unremarkable. Other: None CT CERVICAL SPINE FINDINGS Alignment: No subluxation Skull base and vertebrae: No acute fracture. No primary bone lesion or focal pathologic process. Soft tissues and spinal canal: No prevertebral fluid or swelling. No visible canal hematoma. Disc levels: Disc space narrowing and spurring from C4-5 through C6-7. Upper chest: Biapical scarring Other: No acute findings.  Carotid artery calcifications. IMPRESSION: Diffuse cerebral atrophy.  No acute intracranial abnormality. 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 No acute bony abnormality. Electronically Signed   By: Charlett NoseKevin  Dover M.D.   On: 07/08/2018 08:11    Scheduled Meds: . insulin aspart  0-9 Units Subcutaneous Q4H  . pantoprazole (PROTONIX) IV  40 mg Intravenous Q12H   Continuous Infusions: . sodium chloride    . sodium chloride    . sodium chloride 75 mL/hr at 07/09/18 0521     LOS: 1 day   Time spent: 35 minutes.  Tyrone Nineyan B Taelynn Mcelhannon, MD [facility] [email or web address] Password [email or web address] 07/09/2018, 2:39 PM

## 2018-07-09 NOTE — Progress Notes (Signed)
[facility] Progress [facility]  Eric MorseRobert R Narducci 77 y.o. 05-Apr-1941  CC:  Melena  Subjective: Patient doing well this AM. He feels a lot better and more energized. Wants a glass of milk. He has had no further melena. Denies N/V and abdominal pain. He understands the plan for an EGD today. We discussed the possible outcomes including if we do not find anything on EGD. He understands. All questions and concerns addressed.   ROS : Denies N/V, abdominal pain, palpitations, SOB  Objective: Vital signs in last 24 hours: Vitals:   07/09/18 0351 07/09/18 0713  BP: 104/60   Pulse: 98   Resp: 19   Temp: 97.8 F (36.6 C) 97.9 F (36.6 C)  SpO2: 99%    General: Well nourished male in no acute distress Pulm: Good air movement with no wheezing or crackles  CV: Tachycardic, irregular rhythm, no murmurs, no rubs  Abdomen: Active bowel sounds, soft, non-distended, no tenderness to palpation   Lab Results: Recent Labs    07/08/18 0729  07/08/18 2121 07/09/18 0057  NA 131*   < > 136 134*  K 4.0   < > 3.2* 3.2*  CL 96*   < > 103 103  CO2 12*   < > 25 24  GLUCOSE 297*   < > 107* 137*  BUN 101*   < > 83* 77*  CREATININE 3.02*   < > 1.86* 1.72*  CALCIUM 8.3*   < > 8.2* 7.8*  MG 2.5*  --   --   --    < > = values in this interval not displayed.   Recent Labs    07/08/18 1031  AST 50*  ALT 20  ALKPHOS 36*  BILITOT 1.2  PROT 4.7*  ALBUMIN 1.9*   Recent Labs    07/08/18 0729 07/08/18 2121  WBC 26.0* 19.4*  HGB 7.9* 8.8*  HCT 25.0* 25.6*  MCV 92.6 86.5  PLT 448* 334   Recent Labs    07/08/18 0820 07/08/18 2121  LABPROT >90.0* 18.2*  INR >10.00* 1.52   Assessment/Plan:  Eric Calderon is a 77 y.o. male with atrial fibrillation on warfarin who presented to the ED with symptomatic anemia secondary to a supratherapuetic INR and probable upper GI bleed.   Symptomatic Anemia GI Bleed, probable upper  Supratherapeutic INR secondary to Indomethacin use - Nof urther  melena, abdominal pain, N/V  - BUN and creatinine are down trending  - INR at 1.52 last night  - Continue PPI therapy  - Plan for EGD today with further recommendations afterwards.   Will discuss the case further with Dr. Ewing SchleinMagod.   Levora DredgeJustin Rakeen Gaillard MD 07/09/2018, 7:34 AM

## 2018-07-09 NOTE — Progress Notes (Signed)
Merlene Morseobert R Yoss 1:25 PM  Subjective: Patient seen and evaluated withresident and he is doing betterand no signs of bleeding and no new complaints  Objective: Vital signs stable afebrile no acute distress exam please see preassessment evaluation INR decreasedBUN and creatinine decreasedhemoglobin stable  Assessment: GI blood loss in patient on Coumadin and Indocin  Plan: Okay to proceed with endoscopy with anesthesia assistance  [facility] StrattonMAGOD,Zakaria Fromer E  Pager [phone] After 5PM or if no answer call [phone]

## 2018-07-09 NOTE — Op Note (Signed)
[facility] Patient Name: Eric Calderon Procedure Date : 07/09/2018 MRN: [id number] Attending MD: Vida Rigger , MD Date of Birth: Jun 26, 1941 CSN: [id number] Age: 77 Admit Type: Inpatient Procedure:                Upper GI endoscopy Indications:              Acute post hemorrhagic anemia, Melena Providers:                Vida Rigger, MD, Tomma Rakers, RN, Coralee Rud,                            CRNA, Beryle Beams, Technician Referring MD:              Medicines:                Propofol total dose 70 mg IV,40 mg IV lidocaine Complications:            No immediate complications. Estimated Blood Loss:     Estimated blood loss: none. Procedure:                Pre-Anesthesia Assessment:                           - Prior to the procedure, a History and Physical                            was performed, and patient medications and                            allergies were reviewed. The patient's tolerance of                            previous anesthesia was also reviewed. The risks                            and benefits of the procedure and the sedation                            options and risks were discussed with the patient.                            All questions were answered, and informed consent                            was obtained. Prior Anticoagulants: The patient has                            taken Coumadin (warfarin), last dose was 2 days                            prior to procedure. ASA Grade Assessment: II - A                            patient with mild systemic disease. After reviewing  the risks and benefits, the patient was deemed in                            satisfactory condition to undergo the procedure.                           After obtaining informed consent, the endoscope was                            passed under direct vision. Throughout the                            procedure, the patient's blood pressure,  pulse, and                            oxygen saturations were monitored continuously. The                            was introduced through the mouth, and advanced to                            the second part of duodenum. The upper GI endoscopy                            was accomplished without difficulty. The patient                            tolerated the procedure well. Scope In: Scope Out: Findings:      The larynx was normal.      A small hiatal hernia was present. questionable very small segment       Barrett's not biopsied      3 tiny      blood clots were found in the stomach.      The duodenal bulb, first portion of the duodenum and second portion of       the duodenum were normal.      The exam was otherwise without abnormality. Impression:               - Normal larynx.                           - Small hiatal hernia. with questionable very short                            segment Barrett's not biopsied                           - minimal Clotted blood in the stomach.                           - Normal duodenal bulb, first portion of the                            duodenum and second portion of the duodenum.                           -  The examination was otherwise normal.                           - No specimens collected. Recommendation:           - Patient has a contact number available for                            emergencies. The signs and symptoms of potential                            delayed complications were discussed with the                            patient. Return to normal activities tomorrow.                            Written discharge instructions were provided to the                            patient.                           - Clear liquid diet today.                           - Continue present medications.                           - Return to GI clinic PRN.                           - Telephone GI clinic if symptomatic PRN.                            - Perform a colonoscopy at appointment to be                            scheduled. either tomorrow or as outpatient and                            will discussed with patient although no reason to                            think that bleeding source was the colon with a few                            blood clots seen in the stomach Procedure Code(s):        --- Professional ---                           [phone], Esophagogastroduodenoscopy, flexible,                            transoral; diagnostic, including collection of  specimen(s) by brushing or washing, when performed                            (separate procedure) Diagnosis Code(s):        --- Professional ---                           K44.9, Diaphragmatic hernia without obstruction or                            gangrene                           K92.2, Gastrointestinal hemorrhage, unspecified                           D62, Acute posthemorrhagic anemia                           K92.1, Melena (includes Hematochezia) CPT copyright 2017 American Medical Association. All rights reserved. The codes documented in this report are preliminary and upon coder review may  be revised to meet current compliance requirements. Vida Rigger, MD 07/09/2018 1:53:40 PM This report has been signed electronically. Number of Addenda: 0

## 2018-07-10 ENCOUNTER — Encounter (HOSPITAL_COMMUNITY): Payer: Self-pay | Admitting: Family Medicine

## 2018-07-10 DIAGNOSIS — E440 Moderate protein-calorie malnutrition: Secondary | ICD-10-CM

## 2018-07-10 DIAGNOSIS — J449 Chronic obstructive pulmonary disease, unspecified: Secondary | ICD-10-CM

## 2018-07-10 LAB — BASIC METABOLIC PANEL
Anion gap: 7 (ref 5–15)
BUN: 41 mg/dL — AB (ref 8–23)
CHLORIDE: 111 mmol/L (ref 98–111)
CO2: 21 mmol/L — ABNORMAL LOW (ref 22–32)
CREATININE: 1.29 mg/dL — AB (ref 0.61–1.24)
Calcium: 7.7 mg/dL — ABNORMAL LOW (ref 8.9–10.3)
GFR calc Af Amer: >60 mL/min (ref >60)
GFR, EST NON AFRICAN AMERICAN: 52 mL/min — AB (ref >60)
GLUCOSE: 84 mg/dL (ref 70–99)
Potassium: 3.3 mmol/L — ABNORMAL LOW (ref 3.5–5.1)
SODIUM: 139 mmol/L (ref 135–145)

## 2018-07-10 LAB — CBC
HCT: 25.5 % — ABNORMAL LOW (ref 39.0–52.0)
Hemoglobin: 8.5 g/dL — ABNORMAL LOW (ref 13.0–17.0)
MCH: 29.7 pg (ref 26.0–34.0)
MCHC: 33.3 g/dL (ref 30.0–36.0)
MCV: 89.2 fL (ref 78.0–100.0)
PLATELETS: 384 10*3/uL (ref 150–400)
RBC: 2.86 MIL/uL — ABNORMAL LOW (ref 4.22–5.81)
RDW: 14.9 % (ref 11.5–15.5)
WBC: 15.9 10*3/uL — ABNORMAL HIGH (ref 4.0–10.5)

## 2018-07-10 LAB — GLUCOSE, CAPILLARY
GLUCOSE-CAPILLARY: 95 mg/dL (ref 70–99)
GLUCOSE-CAPILLARY: 100 mg/dL — AB (ref 70–99)
Glucose-Capillary: 172 mg/dL — ABNORMAL HIGH (ref 70–99)
Glucose-Capillary: 144 mg/dL — ABNORMAL HIGH (ref 70–99)

## 2018-07-10 LAB — MAGNESIUM: Magnesium: 1.7 mg/dL (ref 1.7–2.4)

## 2018-07-10 MED ORDER — PANTOPRAZOLE SODIUM 40 MG PO TBEC
40.0000 mg | DELAYED_RELEASE_TABLET | Freq: Two times a day (BID) | ORAL | Status: DC
  Administered 2018-07-10 – 2018-07-12 (×4): 40 mg via ORAL
  Filled 2018-07-10 (×4): qty 1

## 2018-07-10 MED ORDER — ENSURE ENLIVE PO LIQD
237.0000 mL | Freq: Two times a day (BID) | ORAL | Status: DC
  Administered 2018-07-11 – 2018-07-12 (×3): 237 mL via ORAL

## 2018-07-10 MED ORDER — IPRATROPIUM-ALBUTEROL 0.5-2.5 (3) MG/3ML IN SOLN: 3.0000 mL | Freq: Four times a day (QID) | RESPIRATORY_TRACT | Status: DC | PRN

## 2018-07-10 MED ORDER — POTASSIUM CHLORIDE CRYS ER 20 MEQ PO TBCR
40.0000 meq | EXTENDED_RELEASE_TABLET | Freq: Once | ORAL | Status: AC
  Administered 2018-07-10: 40 meq via ORAL
  Filled 2018-07-10: qty 2

## 2018-07-10 MED ORDER — MAGNESIUM SULFATE IN D5W 1-5 GM/100ML-% IV SOLN
1.0000 g | Freq: Once | INTRAVENOUS | Status: AC
  Administered 2018-07-10: 1 g via INTRAVENOUS
  Filled 2018-07-10: qty 100

## 2018-07-10 MED ORDER — APIXABAN 5 MG PO TABS
5.0000 mg | ORAL_TABLET | Freq: Two times a day (BID) | ORAL | Status: DC
  Administered 2018-07-10 – 2018-07-12 (×4): 5 mg via ORAL
  Filled 2018-07-10 (×4): qty 1

## 2018-07-10 NOTE — Anesthesia Postprocedure Evaluation (Signed)
Anesthesia Post Note  Patient: Eric Calderon  Procedure(s) Performed: ESOPHAGOGASTRODUODENOSCOPY (EGD) WITH PROPOFOL (N/A )     Patient location during evaluation: PACU Anesthesia Type: MAC Level of consciousness: awake and alert Pain management: pain level controlled Vital Signs Assessment: post-procedure vital signs reviewed and stable Respiratory status: spontaneous breathing, nonlabored ventilation, respiratory function stable and patient connected to nasal cannula oxygen Cardiovascular status: stable and blood pressure returned to baseline Postop Assessment: no apparent nausea or vomiting Anesthetic complications: no    Last Vitals:  Vitals:   07/10/18 0001 07/10/18 0357  BP: (!) 87/72 (!) 97/50  Pulse: 90 85  Resp: (!) 25 14  Temp: 37.1 C 37.1 C  SpO2: 99% 100%    Last Pain:  Vitals:   07/10/18 0357  TempSrc: Oral  PainSc:                  Cory Rama S

## 2018-07-10 NOTE — Progress Notes (Signed)
Initial Nutrition Assessment  DOCUMENTATION CODES:   Non-severe (moderate) malnutrition in context of acute illness/injury  INTERVENTION:  Ensure Enlive po BID, each supplement provides 350 kcal and 20 grams of protein  NUTRITION DIAGNOSIS:   Moderate Malnutrition related to acute illness(symptomatic anemia and falls) as evidenced by mild muscle depletion, moderate fat depletion, percent weight loss, energy intake < 75% for > 7 days.  GOAL:   Patient will meet greater than or equal to 90% of their needs   MONITOR:   PO intake, Supplement acceptance, I & O's  REASON FOR ASSESSMENT:   Consult    ASSESSMENT:   Eric Calderon is a 77 y.o. male with a Past Medical History of CAD s/p CABG; HLD; HTN; HLD; COPD; DM; and afib on Coumadin who presents with a fall.  Over the last 10 days, he has had progressively decreased appetite with dizziness and falls. Found to have symptomatic blood loss anemia related to upper GI bleed and AKI on CKD III  Spoke with patient at bedside. He reports 10-11 pound weight loss over 11-12 days since July 4th when he began to fall and was not eating any food. He went up to [place] to visit his Son for the 4th and was eating well, eating out several times a day there, but came back with very little appetite. Per chart it appears he has lost 9 pounds/5.7% of his total bodyweight since June 11th. Reports a UBW of 151-152 pounds. Normal PO intake prior to this consisted of a scrambled egg and toast for breakfast and either fast food or, a cooked meal with meat, a starch, and a vegetable for dinner. Patient also complains of his taste buds changing, but denies any impact on PO intake at this time.  Given entire clinical picture it appears patient meets criteria for moderate malnutrition in context of acute illness. It is unclear what caused his falls, but it appears this also impacted his PO intake, his weight status, and his muscle and fat  stores.  Medications reviewed and include:  Insulin NS at 74mL/hr  Labs reviewed:  K+ 3.3, BUN/Cr 41/1.29   Intake/Output Summary (Last 24 hours) at 07/10/2018 1653 Last data filed at 07/10/2018 1033 Gross per 24 hour  Intake 629.97 ml  Output 1150 ml  Net -520.03 ml  1.6L fluid positive   NUTRITION - FOCUSED PHYSICAL EXAM:    Most Recent Value  Orbital Region  Moderate depletion  Upper Arm Region  No depletion  Thoracic and Lumbar Region  Moderate depletion  Buccal Region  Moderate depletion  Temple Region  Mild depletion  Clavicle Bone Region  No depletion  Clavicle and Acromion Bone Region  Mild depletion  Scapular Bone Region  Mild depletion  Dorsal Hand  No depletion  Patellar Region  Moderate depletion  Anterior Thigh Region  Moderate depletion  Posterior Calf Region  Moderate depletion       Diet Order:   Diet Order           DIET SOFT Room service appropriate? Yes; Fluid consistency: Thin  Diet effective now          EDUCATION NEEDS:   Education needs have been addressed  Skin:  Skin Assessment: Reviewed RN Assessment  Last BM:  07/08/2018  Height:   Ht Readings from Last 1 Encounters:  07/08/18 5\' 8"  (1.727 m)    Weight:   Wt Readings from Last 1 Encounters:  07/10/18 147 lb 4.3 oz (66.8 kg)  Ideal Body Weight:  70 kg  BMI:  Body mass index is 22.39 kg/m.  Estimated Nutritional Needs:   Kcal:  1800-2100 calories  Protein:  90-107 grams  Fluid:  >1.5L    Eric AnoWilliam M. Onia Shiflett, MS, RD LDN Inpatient Clinical Dietitian Pager [phone]

## 2018-07-10 NOTE — Discharge Instructions (Signed)

## 2018-07-10 NOTE — Progress Notes (Signed)
PT Cancellation Note  Patient Details Name: Eric MorseRobert R Balsam MRN: [id number] DOB: 1941-09-08   Cancelled Treatment:    Reason Eval/Treat Not Completed: Pain limiting ability to participate;Other (comment).  Refused again over pain on RLE but did get OOB to take steps with OT per pt.  Will try again in the AM.     Ivar DrapeRuth E Hazel Wrinkle 07/10/2018, 3:06 PM   Samul Dadauth Rosalio Catterton, PT MS Acute Rehab Dept. Number: [facility] [phone] and [facility] [phone]

## 2018-07-10 NOTE — Progress Notes (Addendum)
PROGRESS NOTE  [name]  NGE:[id number] DOB: December 07, 1941 DOA: 07/08/2018 PCP: Marva Panda, NP   Brief Narrative: Eric Calderon is a 77 y.o. male with a Past Medical History of CAD s/p CABG; HLD; HTN; HLD; COPD; DM; and afib on Coumadin who presents with a fall.  Over the last 10 days, he has had progressively decreased appetite with dizziness and falls.  Things worsened Saturday and since then he has also been seeing tarry stools.  Of note, he has always refused colonoscopy and stool testing for blood at his PCP and continues to refuse C-scope; he is willing to have an EGD.  He is DNR.  On exam, he is somewhat frail and ill-appearing.  His skin is sallow.  Exam is otherwise unremarkable.  Labs notable for supratherapeutic INR >10; creatinine 2.62 (baseline 1.64 in 5/19) with BUN 99 (prior 23); lactate 4.98; troponin 0.05, 0.25, Hgb 7.9  Assessment & Plan: Principal Problem:   Symptomatic anemia Active Problems:   Hyperlipidemia with target LDL less than 70   Essential hypertension   Chronic atrial fibrillation (HCC); CHA2DS2-VASc Score - 4   Long term current use of anticoagulant therapy   3-vessel CAD - 70% ISR and circumflex, diffuse 87 the RCA (nondominant), severe bifurcation LAD diagonal lesion 80 and 60% --> sent for CABG x4   Atrial fibrillation with rapid ventricular response (HCC)   Supratherapeutic INR   Diabetes mellitus without complication (HCC)   COPD (chronic obstructive pulmonary disease) (HCC)   Elevated troponin   Rib fracture   GI bleeding   Metabolic acidosis   Acute kidney injury (HCC)   Leukocytosis  Symptomatic acute blood loss anemia due to upper GI bleed: With minimal clots in stomach on EGD 7/17 showing no signs of active bleeding. Barrett's esophagus noted.  - Continue PPI - Monitor CBC daily, hgb 7.9 on admission, stable in 8's s/p 2u PBCs 7/16. Transfusion threshold is probably 8 with CAD.  - No mention of timing on colonoscopy by GI. Continue  soft diet per GI.  Supratherapeutic INR: Possibly due to poor po intake. >10 on admission. Reversed with FFP, vit K.  - INR last 1.37.  - Will restart anticoagulation 7/18 with eliquis, DC coumadin, or per GI.  Chronic AFib with RVR: Ventricular response more controlled now. Soft BPs limiting medication titration.  - Metoprolol restarted with parameters to hold for MAP <70. If limited by hypotension, would need to consider amiodarone.  - Since CrCl remains >63ml/min and stable, will transition to eliquis (discussions of risks/benefits/alternatives on 7/18). I called the pt's PCP's office per his request to notify her, though his PCP, Marva Panda, will not be back in the office until 7/22. A message will be left for her.   AKI on stage III CKD: Prerenal azotemia due to dehydration, poor per oral intake and continued use of diuretic. - Cr improving. Continue monitoring. Now that he has a diet, will stop IVF's. - Note outpatient weights have been trending downward, last was 156lbs in June now 144>147lbs. - Dietitian consulted  Troponin elevation due to demand ischemia in pt with CAD s/p PCI then CABG 2008: In setting of anemia and rapid ventricular response to AFib. ECG with likely rate-related repol abnormality which is stable on recheck. Troponin trending downward. No chest pain. 2D Echo May 28, 2018: Normal LV size.  Moderate LVH.  EF 55-60% with no regional wall motion abnormality.  Unable to assess diastolic function.  Severe biatrial enlargement.  Mild MR. -  No interventions planned unless CP develops.   Hypokalemia: Will supplement, aiming >4. Mg high yesterday.  - Supplement again today. Give Mg for goal >2 with AFib.   - Check BMP, Mg in AM  Falls at home: CT head, C spine without bleeding/dislocation/fracture. Left 8th, 9th ribs fractured, nondisplaced.  - PT ordered - Pain control  HTN: Currently soft BP - Continue metoprolol as tolerated  Leukocytosis: Improving.  Significant pyuria and bacteriuria on urinalysis.  - No urine culture ever sent from ED. Will continue CTX x3 days.   COPD: With reported >100 pack-year smoking history. Now with documented hypoxia with ambulation, suspect atelectasis with rib fx and not getting OOB.  - Incentive spirometry - Duonebs prn  T2DM: HbA1c 7.4%.  - Holding oral medications Janumet, glipizide - SSI  Hyperlipidemia:  - Continue statin  Gout: No flare - Continue allopurinol  DVT prophylaxis: Eliquis. Pharmacy to provide education. Code Status: DNR Family Communication: None at bedside Disposition Plan: SNF recommended by OT.  Consultants:   [facility]  Procedures:   EGD 07/09/2018 by Dr. Ewing Schlein: - Normal larynx. - Small hiatal hernia. with questionable very short segment Barrett's not biopsied - minimal Clotted blood in the stomach. - Normal duodenal bulb, first portion of the duodenum and second portion of the duodenum. - The examination was otherwise normal. - No specimens collected.  - Patient has a contact number available for emergencies. The signs and symptoms of potential delayed complications were discussed with the patient. Return to normal activities tomorrow. Written discharge instructions were provided to the patient. - Clear liquid diet today. - Continue present medications. - Return to GI clinic PRN. - Telephone GI clinic if symptomatic PRN. - Perform a colonoscopy at appointment to be scheduled. either tomorrow or as outpatient and will discussed with patient although no reason to think that bleeding source was the colon with a few blood clots seen in the stomach  Antimicrobials:  Ceftriaxone 7/17/ - 7/19   Subjective: Denies dark stools, bleeding. Bruising stable. Left lower rib pain also stable. Has pain in his right hamstring area when pressure is put on it. Would like to talk anticoagulation over with his PCP, Marva Panda.    Objective: Vitals:   07/10/18 0357  07/10/18 0514 07/10/18 0750 07/10/18 0915  BP: (!) 97/50  115/72   Pulse: 85  87 (!) 110  Resp: 14  20   Temp: 98.7 F (37.1 C)  98.7 F (37.1 C)   TempSrc: Oral  Oral   SpO2: 100%  100%   Weight:  66.8 kg (147 lb 4.3 oz)    Height:        Intake/Output Summary (Last 24 hours) at 07/10/2018 1303 Last data filed at 07/10/2018 1033 Gross per 24 hour  Intake 629.97 ml  Output 1550 ml  Net -920.03 ml   Filed Weights   07/08/18 2256 07/09/18 0515 07/10/18 0514  Weight: 65.1 kg (143 lb 8.3 oz) 65.7 kg (144 lb 13.5 oz) 66.8 kg (147 lb 4.3 oz)   Gen: Frail, pleasant, elderly male in no distress Pulm: Nonlabored breathing room air. Diminished without crackles or wheezing. CV: Irreg. Soft holosystolic apical murmur, rub, or gallop. No JVD, no dependent edema. GI: Abdomen soft, non-tender, non-distended, with normoactive bowel sounds.  Ext: Warm, no deformities. TTP across left inferolateral chest wall.  Skin: Diffuse severe ecchymoses, most notably at right posterolateral thigh without fluctuance or bony deformity. Also at left upper arm.  Neuro: Alert and oriented. No  focal neurological deficits. Psych: Judgement and insight appear fair. Mood euthymic & affect congruent. Behavior is appropriate.    CBC: Recent Labs  Lab 07/08/18 0729 07/08/18 2121 07/09/18 0920 07/10/18 0522  WBC 26.0* 19.4* 17.8* 15.9*  HGB 7.9* 8.8* 8.3* 8.5*  HCT 25.0* 25.6* 24.3* 25.5*  MCV 92.6 86.5 86.5 89.2  PLT 448* 334 328 384   Basic Metabolic Panel: Recent Labs  Lab 07/08/18 0729  07/08/18 1502 07/08/18 2121 07/09/18 0057 07/09/18 0920 07/10/18 0522  NA 131*   < > 135 136 134* 139 139  K 4.0   < > 3.2* 3.2* 3.2* 3.3* 3.3*  CL 96*   < > 102 103 103 109 111  CO2 12*   < > 21* 25 24 21* 21*  GLUCOSE 297*   < > 235* 107* 137* 128* 84  BUN 101*   < > 96* 83* 77* 62* 41*  CREATININE 3.02*   < > 2.18* 1.86* 1.72* 1.50* 1.29*  CALCIUM 8.3*   < > 8.1* 8.2* 7.8* 7.8* 7.7*  MG 2.5*  --   --   --    --   --  1.7   < > = values in this interval not displayed.   GFR: Estimated Creatinine Clearance: 46 mL/min (A) (by C-G formula based on SCr of 1.29 mg/dL (H)). Liver Function Tests: Recent Labs  Lab 07/08/18 1031  AST 50*  ALT 20  ALKPHOS 36*  BILITOT 1.2  PROT 4.7*  ALBUMIN 1.9*   No results for input(s): LIPASE, AMYLASE in the last 168 hours. No results for input(s): AMMONIA in the last 168 hours. Coagulation Profile: Recent Labs  Lab 07/08/18 0820 07/08/18 2121 07/09/18 0920  INR >10.00* 1.52 1.37   Cardiac Enzymes: Recent Labs  Lab 07/08/18 0729 07/08/18 1502 07/08/18 2121 07/09/18 0346 07/09/18 0920  TROPONINI 0.05* 0.25* 0.58* 0.43* 0.25*   BNP (last 3 results) No results for input(s): PROBNP in the last 8760 hours. HbA1C: Recent Labs    07/08/18 0729  HGBA1C 7.4*   CBG: Recent Labs  Lab 07/09/18 1222 07/09/18 1646 07/09/18 2129 07/10/18 0743 07/10/18 1205  GLUCAP 100* 290* 113* 95 172*   Lipid Profile: No results for input(s): CHOL, HDL, LDLCALC, TRIG, CHOLHDL, LDLDIRECT in the last 72 hours. Thyroid Function Tests: Recent Labs    07/08/18 0730  TSH 3.235   Anemia Panel: No results for input(s): VITAMINB12, FOLATE, FERRITIN, TIBC, IRON, RETICCTPCT in the last 72 hours. Urine analysis:    Component Value Date/Time   COLORURINE YELLOW 07/08/2018 1031   APPEARANCEUR HAZY (A) 07/08/2018 1031   LABSPEC 1.009 07/08/2018 1031   PHURINE 5.0 07/08/2018 1031   GLUCOSEU >=500 (A) 07/08/2018 1031   HGBUR LARGE (A) 07/08/2018 1031   BILIRUBINUR NEGATIVE 07/08/2018 1031   KETONESUR NEGATIVE 07/08/2018 1031   PROTEINUR NEGATIVE 07/08/2018 1031   NITRITE NEGATIVE 07/08/2018 1031   LEUKOCYTESUR LARGE (A) 07/08/2018 1031   Recent Results (from the past 240 hour(s))  MRSA PCR Screening     Status: None   Collection Time: 07/08/18  6:10 PM  Result Value Ref Range Status   MRSA by PCR NEGATIVE NEGATIVE Final    Comment:        The GeneXpert  MRSA Assay (FDA approved for NASAL specimens only), is one component of a comprehensive MRSA colonization surveillance program. It is not intended to diagnose MRSA infection nor to guide or monitor treatment for MRSA infections. Performed at [facility], [address] [place], [place] [address]       Radiology Studies: No results found.  Scheduled Meds: . insulin aspart  0-5 Units Subcutaneous QHS  . insulin aspart  0-9 Units Subcutaneous TID WC  . metoprolol tartrate  12.5 mg Oral BID  . pantoprazole (PROTONIX) IV  40 mg Intravenous Q12H  . simvastatin  40 mg Oral q1800   Continuous Infusions: . sodium chloride Stopped (07/10/18 0911)  . cefTRIAXone (ROCEPHIN)  IV Stopped (07/09/18 1628)     LOS: 2 days   Time spent: 25 minutes.  Tyrone Nine, MD [facility] [email or web address] Password [email or web address] 07/10/2018, 1:03 PM

## 2018-07-10 NOTE — Progress Notes (Signed)
OT Note Addendum for Charges    07/10/18 1247  OT Visit Information  Last OT Received On 07/10/18  OT General Charges  $OT Visit 1 Visit  OT Evaluation  $OT Eval Moderate Complexity 1 Mod  OT Treatments  $Self Care/Home Management  23-37 mins   Leean Amezcua A. Brett Albinooffey, M.S., OTR/L Acute Rehab Department: [phone]

## 2018-07-10 NOTE — Progress Notes (Signed)
Text sent to MD stating pt concerns about leg pain and refusal to get oob and complete PT

## 2018-07-10 NOTE — Progress Notes (Signed)
ANTICOAGULATION CONSULT NOTE - Initial Consult  Pharmacy Consult for apixaban Indication: atrial fibrillation  Allergies  Allergen Reactions  . Niaspan [Niacin Er]     Patient Measurements: Height: 5\' 8"  (172.7 cm) Weight: 147 lb 4.3 oz (66.8 kg) IBW/kg (Calculated) : 68.4   Vital Signs: Temp: 98.7 F (37.1 C) (07/18 0750) Temp Source: Oral (07/18 0750) BP: 115/72 (07/18 0750) Pulse Rate: 110 (07/18 0915)  Labs: Recent Labs    07/08/18 0820  07/08/18 2121 07/09/18 0057 07/09/18 0346 07/09/18 0920 07/10/18 0522  HGB  --   --  8.8*  --   --  8.3* 8.5*  HCT  --   --  25.6*  --   --  24.3* 25.5*  PLT  --   --  334  --   --  328 384  APTT 189*  --   --   --   --   --   --   LABPROT >90.0*  --  18.2*  --   --  16.8*  --   INR >10.00*  --  1.52  --   --  1.37  --   CREATININE  --    < > 1.86* 1.72*  --  1.50* 1.29*  TROPONINI  --    < > 0.58*  --  0.43* 0.25*  --    < > = values in this interval not displayed.    Estimated Creatinine Clearance: 46 mL/min (A) (by C-G formula based on SCr of 1.29 mg/dL (H)).   Medical History: Past Medical History:  Diagnosis Date  . Atrial fibrillation with rapid ventricular response (HCC)   . CAD S/P percutaneous coronary angioplasty October 2006   CATH: 80% MID LAD, involving diagonal bifurcation; 99%  AV GROOVE  CIRCUMFLEX --> PCI of circumflex with 3.0 mm 13 and a Cypher DES; planned staged LAD PCI  . CAD, multiple vessel November 2007   LAD lesion persists, 80% pre-D1 with 60% D1 and distal down the one lesion. 70% ISR and circumflex, L. PDA 80%; nondominant RCA - diffuse 80-70%  . Chronic atrial fibrillation (HCC)    Rate control with beta blocker, anticoagulated warfarin. This patients CHA2DS2-VASc Score and unadjusted Ischemic Stroke Rate (% per year) is equal to 3.2 % stroke rate/year from a score of 3  . COPD (chronic obstructive pulmonary disease) (HCC)    Greater than 100 pack year smoking history, quit 2009  . Diabetes  mellitus without complication (HCC)    2012  . Essential hypertension   . Exertional dyspnea Since 2006   a) 8/'11 Myoview: No Ischemic/Infarct; Echo: Nl LV size & fxn, EF 55-60%, mod-Severe LA & RA dilation,mild AoV sclerosis, Mild PHTN;; 4/'14 Echo: LV EF  55-60%, aortic sclerosis, moderate MR, moderate to severe LA and RA dilation, increased RV pressure -mild PHtn; 9/'15: Lexiscan Myovew: No Ischmia or Infarct, Afib - no EF.  . GI bleed   . Hyperlipidemia with target LDL less than 70   . Mild mitral regurgitation by prior echocardiography 03/2013   Echo 2019:  Mild MR  . Rib fracture   . 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 Supratherapeutic INR   . Symptomatic anemia     Assessment: 77 yo male with CAF on warfarin PTA. INR on admit >10. Patient s/p EGD and will now resume anticoagulation with apixaban instead of warfarin. Patient also admitted with AKI on CKD which has now largely resolved.  Goal of Therapy:  Monitor platelets by anticoagulation protocol: Yes   Plan:  Apixaban 5mg  BID Monitor for s/sx of bleeding  Shaylie Eklund A. Jeanella CrazePierce, PharmD, BCPS Clinical Pharmacist [place] Pager: [phone] Please utilize Amion for appropriate phone number to reach the unit pharmacist [facility])   07/10/2018,2:43 PM

## 2018-07-10 NOTE — Progress Notes (Signed)
PT Cancellation Note  Patient Details Name: Eric MorseRobert R Calderon MRN: [id number] DOB: 12/21/1941   Cancelled Treatment:    Reason Eval/Treat Not Completed: Pain limiting ability to participate;Other (comment).  Pt reporting significant concern for pain on R thigh after mult falls, but also reluctant to work on it.  Notified nursing to report to MD and will try later as MD guidance is given.   [name] 07/10/2018, 9:12 AM   [name], PT MS Acute Rehab Dept. Number: [facility] [phone] and [facility] [phone]

## 2018-07-10 NOTE — Progress Notes (Signed)
Occupational Therapy Evaluation Patient Details Name: Eric Calderon MRN: [id number] DOB: Oct 02, 1941 Today's Date: 07/10/2018    History of Present Illness Eric Calderon is a 77 y.o. male with medical history significant make A. Fib on Coumadin, CAD status post CABG 4 2008, hyperlipidemia, hypertension, diabetes, COPD not on home oxygen presents to the emergency Department chief complaint of persistent worsening generalized weakness with multiple falls. Initial evaluation reveals likely GI bleed secondary to supratherapeutic INR.    [name]     Clinical Impression   PTA, pt was living at home alone, and was independent with ADLs, IADLs and was driving. Upon arrival, pt reports he has not been OOB since Sunday. Sitting EOB pt's SpO2 95% and pt able to maintain conversation. Pt became orthostatic with short distance ambulation, pt reported feeling dizzy and lightheaded and appeared pale. Pt was able to ambulate back to the recliner without LOB. Upon return, SpO2 82 and BP 60/40, after 3 min reclined with feet elevated and pursed lip breathing SpO2 87, BP 102/50, nsg aware. Pt currently requires modA for functional mobility and mod A for LB ADLs.  Due to decline in current level of function, pt would benefit from acute OT to address establish goals to facilitate safe D/C home. At this time, recommend SNF follow-up.      Follow Up Recommendations  Supervision/Assistance - 24 hour;SNF    Equipment Recommendations  Tub/shower seat;3 in 1 bedside commode    Recommendations for Other Services PT consult     Precautions / Restrictions Precautions Precautions: Fall Restrictions Weight Bearing Restrictions: No      Mobility Bed Mobility Overal bed mobility: Needs Assistance Bed Mobility: Supine to Sit     Supine to sit: Min assist;HOB elevated     General bed mobility comments: minA to progress to EOB, pt limited by pain in RLE and buttocks  Transfers Overall transfer level: Needs  assistance Equipment used: Rolling walker (2 wheeled) Transfers: Sit to/from Stand Sit to Stand: Mod assist         General transfer comment: pt required modA for powerup, significantly limited by pain in RLE     Balance Overall balance assessment: Needs assistance Sitting-balance support: No upper extremity supported Sitting balance-Leahy Scale: Fair Sitting balance - Comments: pt able to bend over to attempt adjusting socks while sitting EOb   Standing balance support: Bilateral upper extremity supported Standing balance-Leahy Scale: Fair Standing balance comment: pt demonstrated fair static standing without LOB;heavy reliance on RW for BUE support for pain management and instability                            ADL either performed or assessed with clinical judgement   ADL Overall ADL's : Needs assistance/impaired Eating/Feeding: Supervision/ safety   Grooming: Minimal assistance;Standing   Upper Body Bathing: Set up;Sitting   Lower Body Bathing: Moderate assistance   Upper Body Dressing : Set up   Lower Body Dressing: Moderate assistance Lower Body Dressing Details (indicate cue type and reason): pt unable to adjust socks while sitting EOB Toilet Transfer: Moderate assistance;RW;BSC Toilet Transfer Details (indicate cue type and reason): pt reports sensation to urinate comes on quickly; Toileting- Clothing Manipulation and Hygiene: Moderate assistance       Functional mobility during ADLs: Moderate assistance;Rolling walker General ADL Comments: pt became orthostatic after ambulating <10 feet in the room;     Vision Baseline Vision/History: Wears glasses Wears Glasses: Reading only Patient  Visual Report: No change from baseline       Perception     Praxis      Pertinent Vitals/Pain Pain Assessment: 0-10 Pain Score: 7  Pain Location: R thigh;buttocks  Pain Descriptors / Indicators: Grimacing;Guarding;Sharp;Sore Pain Intervention(s): Limited  activity within patient's tolerance;Monitored during session     Hand Dominance Left   Extremity/Trunk Assessment Upper Extremity Assessment Upper Extremity Assessment: Generalized weakness;LUE deficits/detail LUE Deficits / Details: visible bruise on LUE LUE Sensation: WNL LUE Coordination: WNL   Lower Extremity Assessment Lower Extremity Assessment: RLE deficits/detail;Defer to PT evaluation RLE Deficits / Details: visible bruise on upper thigh       Communication Communication Communication: No difficulties   Cognition Arousal/Alertness: Awake/alert Behavior During Therapy: WFL for tasks assessed/performed Overall Cognitive Status: Within Functional Limits for tasks assessed Area of Impairment: Safety/judgement                         Safety/Judgement: Decreased awareness of safety;Decreased awareness of deficits     General Comments: Pt was in good spirits upon arrival and motivated to work with therapy;   General Comments  pt educated on current deficits and impact on safety;educated pt on concern with increased fall risk and return to home;pt verbalized understanding and was hesitantly agreeable to rehab prior to return home;pt concerned with potential cost of rehab;pt became orthostatic with short distance ambulation, pt reported feeling dizzy and lightheaded, SpO2 67 but not good pleth, pt able to verbalize how he was feeling and maintain stability to ambulate back to recliner;upon return to recliner pt BP 60/40, pt reported he felt dizzy and light headed but did not feel like he was going to pass out; elevated pt's feet and educated him on pursed lip breathing, after 3 min SpO2 87 BP 102/50, nsg aware;pt reports this feeling did not occur at home, but when falls occurred there were a couple that he was unsure how the fall occurred and before he knew it he was on the ground      Exercises     Shoulder Instructions      Home Living Family/patient expects to be  discharged to:: Private residence Living Arrangements: Alone Available Help at Discharge: Neighbor;Available PRN/intermittently Type of Home: Mobile home       Home Layout: One level     Bathroom Shower/Tub: Tub only;Walk-in shower   Bathroom Toilet: Standard Bathroom Accessibility: Yes How Accessible: Accessible via walker Home Equipment: Walker - 2 wheels          Prior Functioning/Environment Level of Independence: Independent        Comments: pt reports about 40 falls that started saturday;pt reports no falls        OT Problem List: Decreased strength;Decreased range of motion;Decreased activity tolerance;Impaired balance (sitting and/or standing);Decreased safety awareness;Cardiopulmonary status limiting activity;Pain      OT Treatment/Interventions: Self-care/ADL training;Therapeutic exercise;Energy conservation;DME and/or AE instruction;Therapeutic activities;Cognitive remediation/compensation;Patient/family education;Balance training    OT Goals(Current goals can be found in the care plan section) Acute Rehab OT Goals Patient Stated Goal: to get better and go home OT Goal Formulation: With patient Time For Goal Achievement: 07/24/18 Potential to Achieve Goals: Good  OT Frequency: Min 2X/week   Barriers to D/C: Decreased caregiver support  pt does not have 24/7 support available;[place];neighbor available PRN       Co-evaluation              AM-PAC PT "6 Clicks"  Daily Activity     Outcome Measure Help from another person eating meals?: None Help from another person taking care of personal grooming?: A Little Help from another person toileting, which includes using toliet, bedpan, or urinal?: A Lot Help from another person bathing (including washing, rinsing, drying)?: A Lot Help from another person to put on and taking off regular upper body clothing?: A Little Help from another person to put on and taking off regular lower body  clothing?: A Lot 6 Click Score: 16   End of Session Equipment Utilized During Treatment: Gait belt;Rolling walker Nurse Communication: Mobility status;Other (comment)(orthostatic)  Activity Tolerance: Treatment limited secondary to medical complications (Comment)(pt orthostatic with ambulation) Patient left: in chair;with call bell/phone within reach  OT Visit Diagnosis: Other abnormalities of gait and mobility (R26.89);Unsteadiness on feet (R26.81);Repeated falls (R29.6);Muscle weakness (generalized) (M62.81);Pain Pain - Right/Left: Right Pain - part of body: Leg(buttocks)                Time: [id number] OT Time Calculation (min): 38 min Charges:    G-Codes:     Diona Brownereresa Karis Emig OTS    Diona Brownereresa Abdelaziz Westenberger 07/10/2018, 12:46 PM

## 2018-07-11 DIAGNOSIS — D649 Anemia, unspecified: Secondary | ICD-10-CM

## 2018-07-11 LAB — BASIC METABOLIC PANEL
Anion gap: 7 (ref 5–15)
BUN: 26 mg/dL — AB (ref 8–23)
CO2: 23 mmol/L (ref 22–32)
CREATININE: 1.2 mg/dL (ref 0.61–1.24)
Calcium: 7.8 mg/dL — ABNORMAL LOW (ref 8.9–10.3)
Chloride: 105 mmol/L (ref 98–111)
GFR, EST NON AFRICAN AMERICAN: 57 mL/min — AB (ref >60)
Glucose, Bld: 91 mg/dL (ref 70–99)
Potassium: 3.5 mmol/L (ref 3.5–5.1)
SODIUM: 135 mmol/L (ref 135–145)

## 2018-07-11 LAB — CBC
HCT: 23.5 % — ABNORMAL LOW (ref 39.0–52.0)
Hemoglobin: 7.8 g/dL — ABNORMAL LOW (ref 13.0–17.0)
MCH: 30.2 pg (ref 26.0–34.0)
MCHC: 33.2 g/dL (ref 30.0–36.0)
MCV: 91.1 fL (ref 78.0–100.0)
PLATELETS: 429 10*3/uL — AB (ref 150–400)
RBC: 2.58 MIL/uL — AB (ref 4.22–5.81)
RDW: 15.2 % (ref 11.5–15.5)
WBC: 14 10*3/uL — ABNORMAL HIGH (ref 4.0–10.5)

## 2018-07-11 LAB — PREPARE RBC (CROSSMATCH)

## 2018-07-11 LAB — GLUCOSE, CAPILLARY
GLUCOSE-CAPILLARY: 96 mg/dL (ref 70–99)
GLUCOSE-CAPILLARY: 268 mg/dL — AB (ref 70–99)
GLUCOSE-CAPILLARY: 118 mg/dL — AB (ref 70–99)
GLUCOSE-CAPILLARY: 149 mg/dL — AB (ref 70–99)

## 2018-07-11 LAB — MAGNESIUM: MAGNESIUM: 1.9 mg/dL (ref 1.7–2.4)

## 2018-07-11 MED ORDER — SODIUM CHLORIDE 0.9% IV SOLUTION
Freq: Once | INTRAVENOUS | Status: AC
  Administered 2018-07-11: 11:00:00 via INTRAVENOUS

## 2018-07-11 MED ORDER — APIXABAN 5 MG PO TABS: 5.0000 mg | ORAL_TABLET | Freq: Two times a day (BID) | ORAL | 0 refills | Status: AC

## 2018-07-11 NOTE — NC FL2 (Signed)
[place] MEDICAID FL2 LEVEL OF CARE SCREENING TOOL     IDENTIFICATION  Patient Name: Eric Calderon Birthdate: Dec 07, 1941 Sex: male Admission Date (Current Location): 07/08/2018  [facility] and [place] Number:  Producer, television/film/video and Address:  [facility], [address] [place], [place] [address]      Provider Number: [id number]  Attending Physician Name and Address:  Haydee Monica, MD  Relative Name and Phone Number:       Current Level of Care: Hospital Recommended Level of Care: Skilled Nursing Facility Prior Approval Number:    Date Approved/Denied:   PASRR Number: [id number]  Discharge Plan: SNF    Current Diagnoses: Patient Active Problem List   Diagnosis Date Noted  . Malnutrition of moderate degree 07/10/2018  . Atrial fibrillation with rapid ventricular response (HCC) 07/08/2018  . Supratherapeutic INR 07/08/2018  . Elevated troponin 07/08/2018  . GI bleeding 07/08/2018  . Metabolic acidosis 07/08/2018  . Acute kidney injury (HCC) 07/08/2018  . Leukocytosis 07/08/2018  . Diabetes mellitus without complication (HCC)   . Symptomatic anemia   . COPD (chronic obstructive pulmonary disease) (HCC)   . Rib fracture   . GI bleed   . Post-nasal drip 04/04/2014  . Emphysema lung (HCC) 02/06/2014  . Moderate mitral regurgitation 02/06/2014  . 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 Chronic atrial fibrillation (HCC); CHA2DS2-VASc Score - 4 03/11/2013  . Long term current use of anticoagulant therapy 03/11/2013  . Exertional dyspnea 10/04/2008  . Hyperlipidemia with target LDL less than 70 10/01/2008  . Essential hypertension 10/01/2008    Orientation RESPIRATION BLADDER Height & Weight     Self, Time, Place, Situation  Normal Continent Weight: 64.4 kg (141 lb 15.6 oz)(in bed) Height:  5\' 8"  (172.7 cm)  BEHAVIORAL  SYMPTOMS/MOOD NEUROLOGICAL BOWEL NUTRITION STATUS      Continent Diet(Please see DC Summary)  AMBULATORY STATUS COMMUNICATION OF NEEDS Skin   Limited Assist Verbally Other (Comment)(Wound on buttocks)                       Personal Care Assistance Level of Assistance  Feeding, Dressing, Bathing Bathing Assistance: Limited assistance Feeding assistance: Independent Dressing Assistance: Limited assistance     Functional Limitations Info  Sight, Hearing, Speech Sight Info: Adequate Hearing Info: Adequate Speech Info: Adequate    SPECIAL CARE FACTORS FREQUENCY  PT (By licensed PT), OT (By licensed OT)     PT Frequency: 5x/week OT Frequency: 3x/week            Contractures      Additional Factors Info  Code Status, Allergies, Insulin Sliding Scale Code Status Info: DNR Allergies Info: Niaspan Niacin Er   Insulin Sliding Scale Info: Daily at bedtime and with meals       Current Medications (07/11/2018):  This is the current hospital active medication list Current Facility-Administered Medications  Medication Dose Route Frequency Provider Last Rate Last Dose  . allopurinol (ZYLOPRIM) tablet 100 mg  100 mg Oral Daily PRN Tyrone Nine, MD      . apixaban Everlene Balls) tablet 5 mg  5 mg Oral BID Lodema Hong A, RPH   5 mg at 07/11/18 0856  . cefTRIAXone (ROCEPHIN) 1 g in sodium chloride 0.9 % 100 mL IVPB  1 g Intravenous Q24H Tyrone Nine, MD 200 mL/hr  at 07/10/18 1544 1 g at 07/10/18 1544  . feeding supplement (ENSURE ENLIVE) (ENSURE ENLIVE) liquid 237 mL  237 mL Oral BID BM Tyrone NineGrunz, Ryan B, MD   237 mL at 07/11/18 1302  . insulin aspart (novoLOG) injection 0-5 Units  0-5 Units Subcutaneous QHS Hazeline JunkerGrunz, Ryan B, MD      . insulin aspart (novoLOG) injection 0-9 Units  0-9 Units Subcutaneous TID WC Tyrone NineGrunz, Ryan B, MD   1 Units at 07/10/18 1708  . ipratropium-albuterol (DUONEB) 0.5-2.5 (3) MG/3ML nebulizer solution 3 mL  3 mL Nebulization Q6H PRN Hazeline JunkerGrunz, Ryan B, MD      .  metoprolol tartrate (LOPRESSOR) tablet 12.5 mg  12.5 mg Oral BID Tyrone NineGrunz, Ryan B, MD   12.5 mg at 07/11/18 0857  . pantoprazole (PROTONIX) EC tablet 40 mg  40 mg Oral BID Tyrone NineGrunz, Ryan B, MD   40 mg at 07/11/18 0900  . simvastatin (ZOCOR) tablet 40 mg  40 mg Oral q1800 Tyrone NineGrunz, Ryan B, MD   40 mg at 07/10/18 1708     Discharge Medications: Please see discharge summary for a list of discharge medications.  Relevant Imaging Results:  Relevant Lab Results:   Additional Information SSN: [id number]  Ashdon Gillson S BrahamRayyan, [place]WA

## 2018-07-11 NOTE — Progress Notes (Signed)
Pt given instructions on symptoms of reactions related to blood transfusion.  Pt verbalized understanding.

## 2018-07-11 NOTE — Clinical Social Work Note (Signed)
Clinical Social Work Assessment  Patient Details  Name: Eric MorseRobert R Calderon MRN: [id number] Date of Birth: 14-Jan-1941  Date of referral:  07/11/18               Reason for consult:  Facility Placement                Permission sought to share information with:  Facility Medical sales representativeContact Representative, Family Supports Permission granted to share information::  Yes, Verbal Permission Granted  Name::        Agency::  SNFs  Relationship::     Contact Information:     Housing/Transportation Living arrangements for the past 2 months:  Mobile Home Source of Information:  Patient Patient Interpreter Needed:  None Criminal Activity/Legal Involvement Pertinent to Current Situation/Hospitalization:  No - Comment as needed Significant Relationships:  Adult Children, Neighbor Lives with:  Self Do you feel safe going back to the place where you live?  No Need for family participation in patient care:  No (Coment)  Care giving concerns:  CSW received consult for possible SNF placement at time of discharge. CSW spoke with patient regarding PT recommendation of SNF placement at time of discharge. Patient reported that he lives alone and would like inpatient rehab. Patient expressed understanding of PT recommendation and is agreeable to SNF placement at time of discharge if [facility] is unable to accept him. CSW to continue to follow and assist with discharge planning needs.   Social Worker assessment / plan:  CSW spoke with patient [facility] before [facility] home.  Employment status:  Retired Health and safety inspectornsurance information:  Medicare PT Recommendations:  Not assessed at this time Information / Referral to community resources:  Skilled Nursing Facility  Patient/Family's Response to care:  Patient recognizes need for rehab before returning home and [place] though he prefers [facility]. CSW contacted [facility] to see if their level of care would be appropriate. CSW requires a PT  evaluation (patient has been refusing to work with them but is now amenable).   Patient/Family's Understanding of and Emotional Response to Diagnosis, Current Treatment, and Prognosis:  Patient/family is realistic regarding therapy needs and expressed being hopeful for rehab placement. Patient expressed understanding of CSW role and discharge process as well as medical condition. No questions/concerns about plan or treatment.    Emotional Assessment Appearance:  Appears stated age Attitude/Demeanor/Rapport:  Engaged Affect (typically observed):  Accepting, Appropriate Orientation:  Oriented to Self, Oriented to Place, Oriented to  Time, Oriented to Situation Alcohol / Substance use:  Not Applicable Psych involvement (Current and /or in the community):  No (Comment)  Discharge Needs  Concerns to be addressed:  Care Coordination Readmission within the last 30 days:  No Current discharge risk:  None Barriers to Discharge:  No Barriers Identified   Eric Calderon S Eric Calderon, LCSWA 07/11/2018, 1:09 PM

## 2018-07-11 NOTE — Progress Notes (Signed)
Benefits check in process for Eliquis.[name].[name].NCM willf/u with results. Gae GallopAngela Trexton Escamilla RN, BSN,CM

## 2018-07-11 NOTE — Progress Notes (Addendum)
4pm-RNCM stated patient now in agreement to [facility]. They are unable to accept patient today but can accept on Saturday. Patient, daughter, RN, and MD aware.   3pm-CSW checked back in with patient to let him know [facility] would not be able to accept him today. Patient stated that he does not want to go to SNF and asked about home services. CSW explained that he can receive home health but they will not be with him very often. He states his neighbor will help him. CSW encouraged patient to speak with his daughter about this plan. He stated he would call her. He also reported that he was not ready to leave the hospital yet and showed CSW the appeal paper. CSW stated that patient did have the right to appeal and that if he chose to do so, he would need to follow the instructions. RNCM to see patient.   Osborne Cascoadia Bennet Kujawa LCSW [phone]

## 2018-07-11 NOTE — Discharge Summary (Addendum)
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, diffuse 87 the RCA (nondominant), severe bifurcation LAD diagonal lesion 80 and 60% --> sent for CABG x4   Atrial fibrillation with rapid ventricular response (HCC)   Supratherapeutic INR   Diabetes mellitus without complication (HCC)   COPD (chronic obstructive pulmonary disease) (HCC)   Elevated troponin   Rib fracture   GI bleeding   Metabolic acidosis   Acute kidney injury (HCC)   Leukocytosis   Malnutrition of moderate degree   Discharge Condition: Stable and improved  Diet recommendation: Cardiac  Filed Weights   07/09/18 0515 07/10/18 0514 07/11/18 0435  Weight: 65.7 kg (144 lb 13.5 oz) 66.8 kg (147 lb 4.3 oz) 64.4 kg (141 lb 15.6 oz)    History of present illness:  Eric Calderon a 77 y.o.malewith a Past Medical History of CAD s/p CABG; HLD; HTN; HLD; COPD; DM; and afib on Coumadin who presents with a fall. Over the last 10 days, he has had progressively decreased appetite with dizziness and falls. Things worsened Saturday and since then he has also been seeing tarry stools. Of note, he has always refused colonoscopy and stool testing for blood at his PCP and continues to refuse C-scope; he is willing to have an EGD. He is DNR. On exam, he is somewhat frail and ill-appearing. His skin is sallow. Exam is otherwise unremarkable. Labs notable for supratherapeutic INR >10; creatinine 2.62 (baseline 1.64 in 5/19) with BUN 99 (prior 23); lactate 4.98; troponin 0.05, 0.25,  Hgb 7.9.  Patient has had no GI bleeding for over 48 hours.  Patient be discharged for short-term rehab to gain as much function as possible.  His Coumadin was stopped he was switched over to Eliquis per GI recommendations and coagulopathic state during this hospitalization    Hospital Course:  Symptomatic acute blood loss anemia due to upper GI bleed: With minimal clots in stomach on EGD 7/17 showing no signs of active bleeding. Barrett's esophagus noted.  - Continue PPI - Monitor CBC daily, hgb 7.9 on admission, stable in 8's s/p 2u PBCs 7/16. Transfusion threshold is probably 8 with CAD.  -Follow-up with Dr. Ewing Calderon with GI in 2 weeks and discuss colonoscopy at that time this was offered during his hospitalization but patient declined  Supratherapeutic INR: Possibly due to poor po intake. >10 on admission. Reversed with FFP, vit K.  - INR last 1.37.  - Will restart anticoagulation 7/18 with eliquis, DC coumadin, or per GI.  Chronic AFib with RVR: Ventricular response more controlled now. Soft BPs limiting medication titration.  - Metoprolol  - Since CrCl remains >60ml/min and stable, will transition to eliquis (discussions of risks/benefits/alternatives on 7/18). I called the pt's PCP's office per his request to notify her, though his PCP, Eric Calderon, will not be back in the office until 7/22. A message will be left for her.   AKI on stage III CKD: Prerenal azotemia due to dehydration, poor per oral intake and continued use of diuretic. - Cr improving. Continue monitoring. Now that he has  a diet, will stop IVF's. - Note outpatient weights have been trending downward, last was 156lbs in June now 144>147lbs. - Dietitian consulted  Troponin elevation due to demand ischemia in pt with CAD s/p PCI then CABG 2008: In setting of anemia and rapid ventricular response to AFib. ECG with likely rate-related repol abnormality which is stable on recheck. Troponin trending downward. No chest  pain. 2D Echo May 28, 2018: Normal LV size.  Moderate LVH.  EF 55-60% with no regional wall motion abnormality.  Unable to assess diastolic function.  Severe biatrial enlargement.  Mild MR. - No interventions planned unless CP develops.   Hypokalemia: Will supplement, aiming >4. Mg high yesterday.  - Supplemented and resolved  Falls at home: CT head, C spine without bleeding/dislocation/fracture. Left 8th, 9th ribs fractured, nondisplaced.  - PT ordered recommending inpatient rehab - Pain control  HTN: Currently soft BP - Continue metoprolol as tolerated  Leukocytosis: Improving. Significant pyuria and bacteriuria on urinalysis.  - No urine culture ever sent from ED. Will continue CTX x3 days.   COPD: With reported >100 pack-year smoking history. Now with documented hypoxia with ambulation, suspect atelectasis with rib fx and not getting OOB.  - Incentive spirometry - Duonebs prn  T2DM: HbA1c 7.4%.  - Holding oral medications Janumet, glipizide - SSI  Hyperlipidemia:  - Continue statin    Procedures: EGD  Consultations:  GI  Discharge Exam: Vitals:   07/11/18 1023 07/11/18 1043  BP: (!) 108/54 (!) 108/54  Pulse: 87 78  Resp: 19 19  Temp: 98.1 F (36.7 C) 98.4 F (36.9 C)  SpO2: 99% 100%    General: Alert and oriented x4 no apparent distress cooperative and friendly Cardiovascular: Regular rate and rhythm without murmurs rubs or gallops Respiratory: Clear to auscultation bilaterally no wheezes rhonchi rales  Discharge Instructions   Discharge Instructions    Amb referral to AFIB Clinic   Complete by:  As directed    Diet - low sodium heart healthy   Complete by:  As directed    Increase activity slowly   Complete by:  As directed      Allergies as of 07/11/2018      Reactions   Niaspan [niacin Er]       Medication List    STOP taking these medications   warfarin 5 MG tablet Commonly known as:  COUMADIN     TAKE these medications    allopurinol 100 MG tablet Commonly known as:  ZYLOPRIM Take 1 tablet by mouth daily as needed (for gout).   apixaban 5 MG Tabs tablet Commonly known as:  ELIQUIS Take 1 tablet (5 mg total) by mouth 2 (two) times daily.   fenofibrate 160 MG tablet Take 1 tablet by mouth daily.   furosemide 40 MG tablet Commonly known as:  LASIX TAKE 1 TABLET(40 MG) BY MOUTH DAILY   glipiZIDE 10 MG 24 hr tablet Commonly known as:  GLUCOTROL XL Take by mouth daily with breakfast.   indomethacin 75 MG CR capsule Commonly known as:  INDOCIN SR Take 75 mg by mouth 2 (two) times daily as needed for mild pain (gout symptoms).   JANUMET XR [phone] MG Tb24 Generic drug:  SitaGLIPtin-MetFORMIN HCl Take 1 tablet by mouth daily.   LANCETS SUPER THIN 28G Misc   metoprolol tartrate 25 MG tablet Commonly known as:  LOPRESSOR Take 0.5 tablets (12.5 mg total) by mouth 2 (two) times daily.   potassium chloride 10 MEQ tablet Commonly known as:  K-DUR,KLOR-CON Take 1 tablet (10 mEq total) by mouth daily.   simvastatin 40 MG tablet Commonly known as:  ZOCOR TAKE 1 TABLET(40 MG) BY MOUTH AT BEDTIME   TRUETEST TEST test strip Generic drug:  glucose blood Inject 1 strip into the skin daily. Use 1 test strip each to check glucose daily      Allergies  Allergen Reactions  . Niaspan Odette Fraction[Niacin Er]    Follow-up Information    Vida RiggerMagod, Marc, MD Follow up in 2 week(s).   Specialty:  Gastroenterology Contact information: [address] [place] [address] [phone]        Eric PandaMillsaps, Kimberly, NP Follow up in 1 week(s).   Contact information: [facility] [address] [place] [address] [phone]            The results of significant diagnostics from this hospitalization (including imaging, microbiology, ancillary and laboratory) are listed below for reference.    Significant Diagnostic Studies: Dg Ribs Unilateral W/chest Left  Result Date:  07/08/2018 CLINICAL DATA:  Multiple falls over the last week, left lower rib pain EXAM: LEFT RIBS AND CHEST - 3+ VIEW COMPARISON:  Chest x-ray of 02/23/2014 FINDINGS: No active infiltrate or effusion is seen. The lungs are slightly hyperaerated. Mediastinal and hilar contours are unremarkable. Median sternotomy sutures are noted from prior CABG. The heart size is stable. Left rib detail films show nondisplaced fractures of the very anterior left eighth and ninth ribs. IMPRESSION: 1. Nondisplaced fractures of the anterior left eighth and ninth ribs. 2. No active lung disease. Electronically Signed   By: Dwyane DeePaul  Barry M.D.   On: 07/08/2018 08:37   Ct Head Wo Contrast  Result Date: 07/08/2018 CLINICAL DATA:  Fall, headache, neck pain EXAM: CT HEAD WITHOUT CONTRAST CT CERVICAL SPINE WITHOUT CONTRAST TECHNIQUE: Multidetector CT imaging of the head and cervical spine was performed following the standard protocol without intravenous contrast. Multiplanar CT image reconstructions of the cervical spine were also generated. COMPARISON:  None. FINDINGS: CT HEAD FINDINGS Brain: Diffuse cerebral atrophy. No acute intracranial abnormality. Specifically, no hemorrhage, hydrocephalus, mass lesion, acute infarction, or significant intracranial injury. Vascular: No hyperdense vessel or unexpected calcification. Skull: No acute calvarial abnormality. Sinuses/Orbits: Visualized paranasal sinuses and mastoids clear. Orbital soft tissues unremarkable. Other: None CT CERVICAL SPINE FINDINGS Alignment: No subluxation Skull base and vertebrae: No acute fracture. No primary bone lesion or focal pathologic process. Soft tissues and spinal canal: No prevertebral fluid or swelling. No visible canal hematoma. Disc levels: Disc space narrowing and spurring from C4-5 through C6-7. Upper chest: Biapical scarring Other: No acute findings.  Carotid artery calcifications. IMPRESSION: Diffuse cerebral atrophy.  No acute intracranial abnormality.  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 No acute bony abnormality. Electronically Signed   By: Charlett NoseKevin  Dover M.D.   On: 07/08/2018 08:11   Ct Cervical Spine Wo Contrast  Result Date: 07/08/2018 CLINICAL DATA:  Fall, headache, neck pain EXAM: CT HEAD WITHOUT CONTRAST CT CERVICAL SPINE WITHOUT CONTRAST TECHNIQUE: Multidetector CT imaging of the head and cervical spine was performed following the standard protocol without intravenous contrast. Multiplanar CT image reconstructions of the cervical spine were also generated. COMPARISON:  None. FINDINGS: CT HEAD FINDINGS Brain: Diffuse cerebral atrophy. No acute intracranial abnormality. Specifically, no hemorrhage, hydrocephalus, mass lesion, acute infarction, or significant intracranial injury. Vascular: No hyperdense vessel or unexpected calcification. Skull: No acute calvarial abnormality. Sinuses/Orbits: Visualized paranasal sinuses and mastoids clear. Orbital soft tissues unremarkable.  Other: None CT CERVICAL SPINE FINDINGS Alignment: No subluxation Skull base and vertebrae: No acute fracture. No primary bone lesion or focal pathologic process. Soft tissues and spinal canal: No prevertebral fluid or swelling. No visible canal hematoma. Disc levels: Disc space narrowing and spurring from C4-5 through C6-7. Upper chest: Biapical scarring Other: No acute findings.  Carotid artery calcifications. IMPRESSION: Diffuse cerebral atrophy.  No acute intracranial abnormality. 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 No acute bony abnormality. Electronically Signed   By: Charlett Nose M.D.   On: 07/08/2018 08:11    Microbiology: Recent Results (from the past 240 hour(s))  MRSA PCR Screening     Status: None   Collection Time: 07/08/18  6:10 PM  Result Value Ref Range Status   MRSA by PCR NEGATIVE NEGATIVE Final    Comment:        The GeneXpert MRSA Assay (FDA approved for NASAL specimens only), is one component of  a comprehensive MRSA colonization surveillance program. It is not intended to diagnose MRSA infection nor to guide or monitor treatment for MRSA infections. Performed at [facility], [address] [place], [place] [address]      Labs: Basic Metabolic Panel: Recent Labs  Lab 07/08/18 0729  07/08/18 2121 07/09/18 0057 07/09/18 0920 07/10/18 0522 07/11/18 0445  NA 131*   < > 136 134* 139 139 135  K 4.0   < > 3.2* 3.2* 3.3* 3.3* 3.5  CL 96*   < > 103 103 109 111 105  CO2 12*   < > 25 24 21* 21* 23  GLUCOSE 297*   < > 107* 137* 128* 84 91  BUN 101*   < > 83* 77* 62* 41* 26*  CREATININE 3.02*   < > 1.86* 1.72* 1.50* 1.29* 1.20  CALCIUM 8.3*   < > 8.2* 7.8* 7.8* 7.7* 7.8*  MG 2.5*  --   --   --   --  1.7 1.9   < > = values in this interval not displayed.   Liver Function Tests: Recent Labs  Lab 07/08/18 1031  AST 50*  ALT 20  ALKPHOS 36*  BILITOT 1.2  PROT 4.7*  ALBUMIN 1.9*   No results for input(s): LIPASE, AMYLASE in the last 168 hours. No results for input(s): AMMONIA in the last 168 hours. CBC: Recent Labs  Lab 07/08/18 0729 07/08/18 2121 07/09/18 0920 07/10/18 0522 07/11/18 0445  WBC 26.0* 19.4* 17.8* 15.9* 14.0*  HGB 7.9* 8.8* 8.3* 8.5* 7.8*  HCT 25.0* 25.6* 24.3* 25.5* 23.5*  MCV 92.6 86.5 86.5 89.2 91.1  PLT 448* 334 328 384 429*   Cardiac Enzymes: Recent Labs  Lab 07/08/18 0729 07/08/18 1502 07/08/18 2121 07/09/18 0346 07/09/18 0920  TROPONINI 0.05* 0.25* 0.58* 0.43* 0.25*   BNP: BNP (last 3 results) No results for input(s): BNP in the last 8760 hours.  ProBNP (last 3 results) No results for input(s): PROBNP in the last 8760 hours.  CBG: Recent Labs  Lab 07/10/18 1205 07/10/18 1645 07/10/18 2212 07/11/18 0814 07/11/18 1209  GLUCAP 172* 144* 100* 96 268*       Signed:  Amea Mcphail A MD.  [facility] 07/11/2018, 1:05 PM   Pt discharge delayed yesterday due to bed placement issues.  Pt stable for  discharge today when bed available.  Thank you.

## 2018-07-11 NOTE — Care Management Important Message (Signed)
Important Message  Patient Details  Name: Merlene MorseRobert R Tullo MRN: [id number] Date of Birth: 1941-06-11   Medicare Important Message Given:  Yes    Dorena BodoIris Bettye Sitton 07/11/2018, 3:44 PM

## 2018-07-11 NOTE — Evaluation (Signed)
Physical Therapy Evaluation Patient Details Name: Eric MorseRobert R Arneson MRN: [id number] DOB: Apr 03, 1941 Today's Date: 07/11/2018   History of Present Illness  Eric Calderon is a 77 y.o. male with medical history significant make A. Fib on Coumadin, CAD status post CABG 4 2008, hyperlipidemia, hypertension, diabetes, COPD not on home oxygen presents to the emergency Department chief complaint of persistent worsening generalized weakness with multiple falls. Initial evaluation reveals likely GI bleed secondary to supratherapeutic INR.   Clinical Impression  Pt was seen for evaluation of mobility with notable difficulty standing this afternoon. His plan is to ask for SNF stay and with his case manager's help was able to get daughter and pt to agree to try for SNF bed.  Pt is not wanting this but agreed, and will work in PT at hosp to increase LE strength and standing tolerance.     Follow Up Recommendations SNF    Equipment Recommendations  Rolling walker with 5" wheels    Recommendations for Other Services       Precautions / Restrictions Precautions Precautions: Fall Restrictions Weight Bearing Restrictions: No      Mobility  Bed Mobility Overal bed mobility: Needs Assistance Bed Mobility: Supine to Sit;Sit to Supine     Supine to sit: Min assist Sit to supine: Mod assist   General bed mobility comments: mod assist back to bed due to use of RLE being limited  Transfers Overall transfer level: Needs assistance Equipment used: Rolling walker (2 wheeled);1 person hand held assist Transfers: Sit to/from Stand Sit to Stand: Mod assist         General transfer comment: pt required modA for powerup, significantly limited by pain in RLE   Ambulation/Gait             General Gait Details: unable to get pt to take a step  Stairs            Wheelchair Mobility    Modified Rankin (Stroke Patients Only)       Balance Overall balance assessment: Needs  assistance Sitting-balance support: Feet supported Sitting balance-Leahy Scale: Fair Sitting balance - Comments: supervised at side of bed   Standing balance support: Bilateral upper extremity supported;During functional activity Standing balance-Leahy Scale: Poor Standing balance comment: Pt was dependent on RW for support of standing control                             Pertinent Vitals/Pain Pain Assessment: Faces Faces Pain Scale: Hurts even more Pain Location: R hip and post thigh over bruising Pain Descriptors / Indicators: Guarding;Grimacing Pain Intervention(s): Limited activity within patient's tolerance;Monitored during session;Repositioned    Home Living Family/patient expects to be discharged to:: Private residence Living Arrangements: Alone Available Help at Discharge: Neighbor;Available PRN/intermittently Type of Home: Mobile home Home Access: Stairs to enter Entrance Stairs-Rails: Can reach both;Left;Right Entrance Stairs-Number of Steps: 4 Home Layout: One level Home Equipment: Walker - 2 wheels      Prior Function Level of Independence: Independent         Comments: multiple falls despite not using AD     Hand Dominance   Dominant Hand: Left    Extremity/Trunk Assessment   Upper Extremity Assessment Upper Extremity Assessment: Generalized weakness LUE Deficits / Details: visible bruise on LUE    Lower Extremity Assessment Lower Extremity Assessment: Generalized weakness RLE Deficits / Details: visible bruise on upper thigh    Cervical / Trunk Assessment Cervical /  Trunk Assessment: Normal  Communication   Communication: No difficulties  Cognition Arousal/Alertness: Awake/alert Behavior During Therapy: WFL for tasks assessed/performed Overall Cognitive Status: Within Functional Limits for tasks assessed Area of Impairment: Awareness;Problem solving;Safety/judgement                         Safety/Judgement: Decreased  awareness of safety;Decreased awareness of deficits Awareness: Intellectual Problem Solving: Slow processing;Difficulty sequencing;Requires verbal cues;Requires tactile cues        General Comments General comments (skin integrity, edema, etc.): supine BP was 113/77 with pulse 80 and O2 sat 99%;  Sitting was 103/59 with pulse89 and O2 sat 100%;  standing was light headed with O2 sat down to 84% and then 82% with sitting, BP was 92/58    Exercises     Assessment/Plan    PT Assessment Patient needs continued PT services  PT Problem List Decreased strength;Decreased range of motion;Decreased activity tolerance;Decreased balance;Decreased mobility;Decreased coordination;Decreased cognition;Decreased knowledge of use of DME;Decreased safety awareness;Cardiopulmonary status limiting activity       PT Treatment Interventions DME instruction;Gait training;Functional mobility training;Therapeutic activities;Therapeutic exercise;Balance training;Neuromuscular re-education;Cognitive remediation;Patient/family education;Stair training    PT Goals (Current goals can be found in the Care Plan section)  Acute Rehab PT Goals Patient Stated Goal: to get home asap PT Goal Formulation: With patient Time For Goal Achievement: 07/25/18 Potential to Achieve Goals: Good    Frequency Min 2X/week   Barriers to discharge Inaccessible home environment;Decreased caregiver support home alone with inability to stand    Co-evaluation               AM-PAC PT "6 Clicks" Daily Activity  Outcome Measure Difficulty turning over in bed (including adjusting bedclothes, sheets and blankets)?: Unable Difficulty moving from lying on back to sitting on the side of the bed? : Unable Difficulty sitting down on and standing up from a chair with arms (e.g., wheelchair, bedside commode, etc,.)?: Unable Help needed moving to and from a bed to chair (including a wheelchair)?: A Lot Help needed walking in hospital  room?: Total Help needed climbing 3-5 steps with a railing? : Total 6 Click Score: 7    End of Session Equipment Utilized During Treatment: Gait belt Activity Tolerance: Patient limited by fatigue;Treatment limited secondary to medical complications (Comment)(O2 sats and BP dropped) Patient left: in bed;with call bell/phone within reach;with bed alarm set;with nursing/sitter in room Nurse Communication: Mobility status PT Visit Diagnosis: Unsteadiness on feet (R26.81);Muscle weakness (generalized) (M62.81);Difficulty in walking, not elsewhere classified (R26.2)    Time: 4098-1191 PT Time Calculation (min) (ACUTE ONLY): 31 min   Charges:   PT Evaluation $PT Eval Moderate Complexity: 1 Mod PT Treatments $Therapeutic Activity: 8-22 mins   PT G Codes:   PT G-Codes **NOT FOR INPATIENT CLASS** Functional Assessment Tool Used: AM-PAC 6 Clicks Basic Mobility    Ivar Drape 07/11/2018, 5:42 PM   Samul Dada, PT MS Acute Rehab Dept. Number: [facility] [id number] and [facility] [phone]

## 2018-07-11 NOTE — Progress Notes (Signed)
PT Cancellation Note  Patient Details Name: Eric MorseRobert R Calderon MRN: [id number] DOB: 1941/01/02   Cancelled Treatment:    Reason Eval/Treat Not Completed: Other (comment).  Refused therapy stating he is getting blood and is already accepted to CIR.  Will check back later to try again.  Hgb on chart is 7.8.   Ivar DrapeRuth E Todd Argabright 07/11/2018, 9:30 AM   Samul Dadauth Belem Hintze, PT MS Acute Rehab Dept. Number: [facility] [phone] and [facility] [phone]

## 2018-07-11 NOTE — Progress Notes (Signed)
Saw patient was still in the hospital I offered him a colonoscopy the day after his endoscopy but he preferred to get stronger and get over this hospital stay and proceed as an outpatient and please set of a follow-up with me in 2 weeks after discharge to set that up and call my partner this weekend if any further question or problem

## 2018-07-11 NOTE — Progress Notes (Signed)
.  Rehab Admissions Coordinator Note:  Per pt request and notification of request by CSW Cristobal GoldmannNadia Rayyan, the patient was screened by Nanine MeansKelly Holten Spano for appropriateness for an Inpatient Acute Rehab Consult.  At this time, [facility] does not feel pt has a medical diagnosis that would warrant an inpatient rehab stay. Also noted, pt has refused multiple therapy attempts. We are recommending other dispo needs such as Skilled Nursing Facility if pt feels he is not safe to return home. Please call if questions.    Nanine MeansKelly Tacy Chavis 07/11/2018, 1:31 PM  I can be reached at [phone].

## 2018-07-11 NOTE — Care Management Note (Signed)
Case Management Note  Patient Details  Name: Eric MorseRobert R Calderon MRN: [id number] Date of Birth: 1941/07/12  Subjective/Objective:            Symptomatic anemia. From home alone. Family (son and daughter) lives in [place]. Neighbor helping to care for dog @ residence.       Elouise MunroeDean Gorney  Holly (daughter)   [phone]  [phone]     PCP: Mauro KaufmannKimberly Millsap  Action/Plan: Pt in agreement to transition to SNF per PT's recommendation.CSW managing disposition to facility.  Expected Discharge Date:  07/11/18               Expected Discharge Plan:  Skilled Nursing Facility  In-House Referral:  Clinical Social Work  Discharge planning Services  CM Consult    Status of Service:  Completed, signed off  If discussed at [employer] Length of Stay Meetings, dates discussed:    Additional Comments:  [name], Jonica Bickhart Hudson, RN 07/11/2018, 4:07 PM

## 2018-07-12 LAB — BPAM RBC
BLOOD PRODUCT EXPIRATION DATE: 201908112359
BLOOD PRODUCT EXPIRATION DATE: 201908162359
Blood Product Expiration Date: 201908062359
Blood Product Expiration Date: 201908152359
ISSUE DATE / TIME: 201907161133
ISSUE DATE / TIME: 201907161608
ISSUE DATE / TIME: 201907191013
UNIT TYPE AND RH: 7300
Unit Type and Rh: 7300
Unit Type and Rh: 7300
Unit Type and Rh: 7300

## 2018-07-12 LAB — TYPE AND SCREEN
ABO/RH(D): B POS
ANTIBODY SCREEN: NEGATIVE
UNIT DIVISION: 0
UNIT DIVISION: 0
UNIT DIVISION: 0
Unit division: 0

## 2018-07-12 LAB — CBC
HEMATOCRIT: 26.9 % — AB (ref 39.0–52.0)
HEMOGLOBIN: 8.8 g/dL — AB (ref 13.0–17.0)
MCH: 29.1 pg (ref 26.0–34.0)
MCHC: 32.7 g/dL (ref 30.0–36.0)
MCV: 89.1 fL (ref 78.0–100.0)
Platelets: 441 10*3/uL — ABNORMAL HIGH (ref 150–400)
RBC: 3.02 MIL/uL — ABNORMAL LOW (ref 4.22–5.81)
RDW: 15.8 % — ABNORMAL HIGH (ref 11.5–15.5)
WBC: 13.6 10*3/uL — ABNORMAL HIGH (ref 4.0–10.5)

## 2018-07-12 LAB — BASIC METABOLIC PANEL
ANION GAP: 4 — AB (ref 5–15)
BUN: 19 mg/dL (ref 8–23)
CHLORIDE: 103 mmol/L (ref 98–111)
CO2: 23 mmol/L (ref 22–32)
CREATININE: 1.14 mg/dL (ref 0.61–1.24)
Calcium: 7.5 mg/dL — ABNORMAL LOW (ref 8.9–10.3)
GFR calc Af Amer: >60 mL/min (ref >60)
GFR calc non Af Amer: >60 mL/min (ref >60)
GLUCOSE: 112 mg/dL — AB (ref 70–99)
Potassium: 3.7 mmol/L (ref 3.5–5.1)
Sodium: 130 mmol/L — ABNORMAL LOW (ref 135–145)

## 2018-07-12 LAB — GLUCOSE, CAPILLARY: Glucose-Capillary: 115 mg/dL — ABNORMAL HIGH (ref 70–99)

## 2018-07-12 NOTE — Clinical Social Work Note (Signed)
Clinical Social Worker facilitated patient discharge including contacting patient family and facility to confirm patient discharge plans.  Clinical information faxed to facility and family agreeable with plan.  CSW arranged [facility].  RN to call report prior to discharge.  Number for report:  [phone] Room Number: 308  Clinical Social Worker will sign off for now as social work intervention is no longer needed. Please consult us again if new need arises.  Macario GoldsJesse Heer [place], [place] [phone]

## 2018-07-12 NOTE — Clinical Social Work Placement (Signed)
   CLINICAL SOCIAL WORK PLACEMENT  NOTE  Date:  07/12/2018  Patient Details  Name: Eric Calderon MRN: [id number] Date of Birth: 04/21/1941  Clinical Social Work is seeking post-discharge placement for this patient at the Skilled  Nursing Facility level of care (*CSW will initial, date and re-position this form in  chart as items are completed):  Yes   Patient/family provided with [facility] Clinical Social Work Department's list of facilities offering this level of care within the geographic area requested by the patient (or if unable, by the patient's family).  Yes   Patient/family informed of their freedom to choose among providers that offer the needed level of care, that participate in Medicare, Medicaid or managed care program needed by the patient, have an available bed and are willing to accept the patient.  Yes   Patient/family informed of [facility] ownership interest in [facility] and [facility], as well as of the fact that they are under no obligation to receive care at these facilities.  PASRR submitted to EDS on 07/11/18     PASRR number received on 07/11/18     Existing PASRR number confirmed on       FL2 transmitted to all facilities in geographic area requested by pt/family on 07/11/18     FL2 transmitted to all facilities within larger geographic area on       Patient informed that his/her managed care company has contracts with or will negotiate with certain facilities, including the following:        Yes   Patient/family informed of bed offers received.  Patient chooses bed at [facility]     Physician recommends and patient chooses bed at      Patient to be transferred to [facility] on 07/12/18.  Patient to be transferred to facility by Ambulance     Patient family notified on 07/12/18 of transfer.  Name of family member notified:  Patient to notify family of discharge     PHYSICIAN Please prepare priority  discharge summary, including medications     Additional Comment:   Macario GoldsJesse Bettyjane Shenoy, LCSW [phone]

## 2018-07-12 NOTE — Progress Notes (Signed)
Called to give report.  Transferred to nurse with no answer.

## 2018-07-12 NOTE — Progress Notes (Signed)
Pt gdischarge instructions, prescriptions, and care notes added to chart and sent to [facility]. IV was discontinued, no redness, pain, or swelling noted at this time. Pt left the floor via stretcher with PTAR in stable condition.

## 2018-07-14 ENCOUNTER — Other Ambulatory Visit: Payer: Self-pay

## 2018-07-14 ENCOUNTER — Emergency Department (HOSPITAL_COMMUNITY): Payer: Medicare Other

## 2018-07-14 ENCOUNTER — Inpatient Hospital Stay (HOSPITAL_COMMUNITY)
Admission: EM | Admit: 2018-07-14 | Discharge: 2018-07-18 | DRG: 689 | Disposition: A | Discharge status: Skilled Nursing Facility | Payer: Medicare Other | Attending: Family Medicine | Admitting: Family Medicine

## 2018-07-14 ENCOUNTER — Encounter (HOSPITAL_COMMUNITY): Payer: Self-pay | Admitting: *Deleted

## 2018-07-14 DIAGNOSIS — J439 Emphysema, unspecified: Secondary | ICD-10-CM | POA: Diagnosis present

## 2018-07-14 DIAGNOSIS — M25562 Pain in left knee: Secondary | ICD-10-CM

## 2018-07-14 DIAGNOSIS — R338 Other retention of urine: Secondary | ICD-10-CM

## 2018-07-14 DIAGNOSIS — I2510 Atherosclerotic heart disease of native coronary artery without angina pectoris: Secondary | ICD-10-CM | POA: Diagnosis present

## 2018-07-14 DIAGNOSIS — I482 Chronic atrial fibrillation, unspecified: Secondary | ICD-10-CM

## 2018-07-14 DIAGNOSIS — J410 Simple chronic bronchitis: Secondary | ICD-10-CM | POA: Diagnosis not present

## 2018-07-14 DIAGNOSIS — E876 Hypokalemia: Secondary | ICD-10-CM | POA: Diagnosis present

## 2018-07-14 DIAGNOSIS — Z66 Do not resuscitate: Secondary | ICD-10-CM | POA: Diagnosis present

## 2018-07-14 DIAGNOSIS — Z888 Allergy status to other drugs, medicaments and biological substances status: Secondary | ICD-10-CM

## 2018-07-14 DIAGNOSIS — K922 Gastrointestinal hemorrhage, unspecified: Secondary | ICD-10-CM | POA: Diagnosis present

## 2018-07-14 DIAGNOSIS — E43 Unspecified severe protein-calorie malnutrition: Secondary | ICD-10-CM | POA: Diagnosis present

## 2018-07-14 DIAGNOSIS — Z79899 Other long term (current) drug therapy: Secondary | ICD-10-CM

## 2018-07-14 DIAGNOSIS — E871 Hypo-osmolality and hyponatremia: Secondary | ICD-10-CM | POA: Diagnosis present

## 2018-07-14 DIAGNOSIS — N3091 Cystitis, unspecified with hematuria: Secondary | ICD-10-CM

## 2018-07-14 DIAGNOSIS — K625 Hemorrhage of anus and rectum: Secondary | ICD-10-CM

## 2018-07-14 DIAGNOSIS — K2270 Barrett's esophagus without dysplasia: Secondary | ICD-10-CM | POA: Diagnosis present

## 2018-07-14 DIAGNOSIS — Z7984 Long term (current) use of oral hypoglycemic drugs: Secondary | ICD-10-CM

## 2018-07-14 DIAGNOSIS — Z951 Presence of aortocoronary bypass graft: Secondary | ICD-10-CM

## 2018-07-14 DIAGNOSIS — N183 Chronic kidney disease, stage 3 (moderate): Secondary | ICD-10-CM | POA: Diagnosis present

## 2018-07-14 DIAGNOSIS — M7022 Olecranon bursitis, left elbow: Secondary | ICD-10-CM | POA: Diagnosis present

## 2018-07-14 DIAGNOSIS — I25119 Atherosclerotic heart disease of native coronary artery with unspecified angina pectoris: Secondary | ICD-10-CM | POA: Diagnosis present

## 2018-07-14 DIAGNOSIS — Z87891 Personal history of nicotine dependence: Secondary | ICD-10-CM

## 2018-07-14 DIAGNOSIS — E440 Moderate protein-calorie malnutrition: Secondary | ICD-10-CM | POA: Diagnosis present

## 2018-07-14 DIAGNOSIS — E785 Hyperlipidemia, unspecified: Secondary | ICD-10-CM | POA: Diagnosis present

## 2018-07-14 DIAGNOSIS — E1151 Type 2 diabetes mellitus with diabetic peripheral angiopathy without gangrene: Secondary | ICD-10-CM

## 2018-07-14 DIAGNOSIS — R296 Repeated falls: Secondary | ICD-10-CM | POA: Diagnosis present

## 2018-07-14 DIAGNOSIS — I129 Hypertensive chronic kidney disease with stage 1 through stage 4 chronic kidney disease, or unspecified chronic kidney disease: Secondary | ICD-10-CM | POA: Diagnosis present

## 2018-07-14 DIAGNOSIS — L03114 Cellulitis of left upper limb: Secondary | ICD-10-CM | POA: Diagnosis present

## 2018-07-14 DIAGNOSIS — Z6822 Body mass index (BMI) 22.0-22.9, adult: Secondary | ICD-10-CM

## 2018-07-14 DIAGNOSIS — D649 Anemia, unspecified: Secondary | ICD-10-CM | POA: Diagnosis present

## 2018-07-14 DIAGNOSIS — D62 Acute posthemorrhagic anemia: Secondary | ICD-10-CM | POA: Diagnosis present

## 2018-07-14 DIAGNOSIS — R339 Retention of urine, unspecified: Secondary | ICD-10-CM

## 2018-07-14 DIAGNOSIS — M11262 Other chondrocalcinosis, left knee: Secondary | ICD-10-CM | POA: Diagnosis present

## 2018-07-14 DIAGNOSIS — I340 Nonrheumatic mitral (valve) insufficiency: Secondary | ICD-10-CM | POA: Diagnosis present

## 2018-07-14 DIAGNOSIS — Z9861 Coronary angioplasty status: Secondary | ICD-10-CM

## 2018-07-14 DIAGNOSIS — J449 Chronic obstructive pulmonary disease, unspecified: Secondary | ICD-10-CM | POA: Diagnosis present

## 2018-07-14 DIAGNOSIS — D72829 Elevated white blood cell count, unspecified: Secondary | ICD-10-CM | POA: Diagnosis present

## 2018-07-14 DIAGNOSIS — R310 Gross hematuria: Secondary | ICD-10-CM

## 2018-07-14 DIAGNOSIS — I10 Essential (primary) hypertension: Secondary | ICD-10-CM | POA: Diagnosis present

## 2018-07-14 DIAGNOSIS — Z7901 Long term (current) use of anticoagulants: Secondary | ICD-10-CM

## 2018-07-14 DIAGNOSIS — E1122 Type 2 diabetes mellitus with diabetic chronic kidney disease: Secondary | ICD-10-CM | POA: Diagnosis present

## 2018-07-14 DIAGNOSIS — E119 Type 2 diabetes mellitus without complications: Secondary | ICD-10-CM

## 2018-07-14 LAB — URINALYSIS, ROUTINE W REFLEX MICROSCOPIC
KETONES UR: 15 mg/dL — AB
Nitrite: POSITIVE — AB
PH: 6.5 (ref 5.0–8.0)
Protein, ur: >300 mg/dL — AB
Specific Gravity, Urine: 1.015 (ref 1.005–1.030)

## 2018-07-14 LAB — COMPREHENSIVE METABOLIC PANEL
ALK PHOS: 90 U/L (ref 38–126)
ALT: 34 U/L (ref 0–44)
AST: 70 U/L — ABNORMAL HIGH (ref 15–41)
Albumin: 1.9 g/dL — ABNORMAL LOW (ref 3.5–5.0)
Anion gap: 9 (ref 5–15)
BUN: 16 mg/dL (ref 8–23)
CALCIUM: 7.6 mg/dL — AB (ref 8.9–10.3)
CO2: 24 mmol/L (ref 22–32)
CREATININE: 1.15 mg/dL (ref 0.61–1.24)
Chloride: 95 mmol/L — ABNORMAL LOW (ref 98–111)
GFR calc non Af Amer: 60 mL/min — ABNORMAL LOW (ref >60)
GLUCOSE: 177 mg/dL — AB (ref 70–99)
Potassium: 3.1 mmol/L — ABNORMAL LOW (ref 3.5–5.1)
SODIUM: 128 mmol/L — AB (ref 135–145)
Total Bilirubin: 3 mg/dL — ABNORMAL HIGH (ref 0.3–1.2)
Total Protein: 6 g/dL — ABNORMAL LOW (ref 6.5–8.1)

## 2018-07-14 LAB — CBC WITH DIFFERENTIAL/PLATELET
Abs Immature Granulocytes: 0.2 10*3/uL — ABNORMAL HIGH (ref 0.0–0.1)
Basophils Absolute: 0 10*3/uL (ref 0.0–0.1)
Basophils Relative: 0 %
EOS ABS: 0 10*3/uL (ref 0.0–0.7)
EOS PCT: 0 %
HEMATOCRIT: 29.3 % — AB (ref 39.0–52.0)
Hemoglobin: 9.8 g/dL — ABNORMAL LOW (ref 13.0–17.0)
Immature Granulocytes: 1 %
LYMPHS ABS: 1.1 10*3/uL (ref 0.7–4.0)
Lymphocytes Relative: 7 %
MCH: 29.7 pg (ref 26.0–34.0)
MCHC: 33.4 g/dL (ref 30.0–36.0)
MCV: 88.8 fL (ref 78.0–100.0)
MONO ABS: 0.7 10*3/uL (ref 0.1–1.0)
MONOS PCT: 5 %
Neutro Abs: 13.3 10*3/uL — ABNORMAL HIGH (ref 1.7–7.7)
Neutrophils Relative %: 87 %
Platelets: 628 10*3/uL — ABNORMAL HIGH (ref 150–400)
RBC: 3.3 MIL/uL — ABNORMAL LOW (ref 4.22–5.81)
RDW: 15.4 % (ref 11.5–15.5)
WBC: 15.5 10*3/uL — AB (ref 4.0–10.5)

## 2018-07-14 LAB — GLUCOSE, CAPILLARY
GLUCOSE-CAPILLARY: 127 mg/dL — AB (ref 70–99)
GLUCOSE-CAPILLARY: 156 mg/dL — AB (ref 70–99)
Glucose-Capillary: 133 mg/dL — ABNORMAL HIGH (ref 70–99)

## 2018-07-14 LAB — CBC
HCT: 30.9 % — ABNORMAL LOW (ref 39.0–52.0)
HCT: 27.2 % — ABNORMAL LOW (ref 39.0–52.0)
HCT: 27.5 % — ABNORMAL LOW (ref 39.0–52.0)
HEMOGLOBIN: 10.2 g/dL — AB (ref 13.0–17.0)
HEMOGLOBIN: 9 g/dL — AB (ref 13.0–17.0)
Hemoglobin: 9.2 g/dL — ABNORMAL LOW (ref 13.0–17.0)
MCH: 29.4 pg (ref 26.0–34.0)
MCH: 29.6 pg (ref 26.0–34.0)
MCH: 30 pg (ref 26.0–34.0)
MCHC: 33 g/dL (ref 30.0–36.0)
MCHC: 33.1 g/dL (ref 30.0–36.0)
MCHC: 33.5 g/dL (ref 30.0–36.0)
MCV: 89 fL (ref 78.0–100.0)
MCV: 89.5 fL (ref 78.0–100.0)
MCV: 89.6 fL (ref 78.0–100.0)
PLATELETS: 608 10*3/uL — AB (ref 150–400)
Platelets: 677 10*3/uL — ABNORMAL HIGH (ref 150–400)
Platelets: 609 10*3/uL — ABNORMAL HIGH (ref 150–400)
RBC: 3.47 MIL/uL — AB (ref 4.22–5.81)
RBC: 3.04 MIL/uL — ABNORMAL LOW (ref 4.22–5.81)
RBC: 3.07 MIL/uL — ABNORMAL LOW (ref 4.22–5.81)
RDW: 15.5 % (ref 11.5–15.5)
RDW: 15.5 % (ref 11.5–15.5)
RDW: 15.4 % (ref 11.5–15.5)
WBC: 13.8 10*3/uL — ABNORMAL HIGH (ref 4.0–10.5)
WBC: 13.6 10*3/uL — ABNORMAL HIGH (ref 4.0–10.5)
WBC: 13.1 10*3/uL — AB (ref 4.0–10.5)

## 2018-07-14 LAB — PROTIME-INR
INR: 2.45
Prothrombin Time: 26.4 seconds — ABNORMAL HIGH (ref 11.4–15.2)

## 2018-07-14 LAB — I-STAT CG4 LACTIC ACID, ED: Lactic Acid, Venous: 1.45 mmol/L (ref 0.5–1.9)

## 2018-07-14 LAB — POC OCCULT BLOOD, ED: Fecal Occult Bld: POSITIVE — AB

## 2018-07-14 LAB — URINALYSIS, MICROSCOPIC (REFLEX): RBC / HPF: >50 RBC/hpf (ref 0–5)

## 2018-07-14 MED ORDER — ALLOPURINOL 100 MG PO TABS: 100.0000 mg | ORAL_TABLET | Freq: Every day | ORAL | Status: DC | PRN

## 2018-07-14 MED ORDER — POLYETHYLENE GLYCOL 3350 17 G PO PACK: 17.0000 g | PACK | Freq: Every day | ORAL | Status: DC | PRN

## 2018-07-14 MED ORDER — SODIUM CHLORIDE 0.9 % IV SOLN
1.0000 g | Freq: Once | INTRAVENOUS | Status: AC
  Administered 2018-07-14: 1 g via INTRAVENOUS
  Filled 2018-07-14: qty 10

## 2018-07-14 MED ORDER — BISACODYL 5 MG PO TBEC
5.0000 mg | DELAYED_RELEASE_TABLET | Freq: Every day | ORAL | Status: DC | PRN
  Administered 2018-07-16: 5 mg via ORAL
  Filled 2018-07-14: qty 1

## 2018-07-14 MED ORDER — METOPROLOL TARTRATE 25 MG PO TABS
25.0000 mg | ORAL_TABLET | Freq: Two times a day (BID) | ORAL | Status: DC
  Administered 2018-07-14 – 2018-07-18 (×9): 25 mg via ORAL
  Filled 2018-07-14 (×9): qty 1

## 2018-07-14 MED ORDER — SIMVASTATIN 40 MG PO TABS
40.0000 mg | ORAL_TABLET | Freq: Every day | ORAL | Status: DC
  Administered 2018-07-14 – 2018-07-17 (×3): 40 mg via ORAL
  Filled 2018-07-14 (×3): qty 1

## 2018-07-14 MED ORDER — CEFTRIAXONE SODIUM 1 G IJ SOLR
1.0000 g | INTRAMUSCULAR | Status: DC
  Administered 2018-07-15 – 2018-07-17 (×3): 1 g via INTRAVENOUS
  Filled 2018-07-14 (×3): qty 10

## 2018-07-14 MED ORDER — ACETAMINOPHEN 325 MG PO TABS: 650.0000 mg | ORAL_TABLET | Freq: Four times a day (QID) | ORAL | Status: DC | PRN

## 2018-07-14 MED ORDER — HYDROCODONE-ACETAMINOPHEN 5-325 MG PO TABS
1.0000 | ORAL_TABLET | ORAL | Status: DC | PRN
  Administered 2018-07-14: 2 via ORAL
  Administered 2018-07-15 – 2018-07-17 (×10): 1 via ORAL
  Filled 2018-07-14: qty 2
  Filled 2018-07-14 (×4): qty 1
  Filled 2018-07-14 (×2): qty 2
  Filled 2018-07-14 (×4): qty 1

## 2018-07-14 MED ORDER — FENOFIBRATE 160 MG PO TABS
160.0000 mg | ORAL_TABLET | Freq: Every day | ORAL | Status: DC
  Administered 2018-07-14 – 2018-07-18 (×5): 160 mg via ORAL
  Filled 2018-07-14 (×5): qty 1

## 2018-07-14 MED ORDER — WHITE PETROLATUM EX OINT
TOPICAL_OINTMENT | CUTANEOUS | Status: AC
  Administered 2018-07-14: 0.2
  Filled 2018-07-14: qty 28.35

## 2018-07-14 MED ORDER — POTASSIUM CHLORIDE IN NACL 40-0.9 MEQ/L-% IV SOLN
INTRAVENOUS | Status: DC
  Administered 2018-07-14 – 2018-07-15 (×2): 75 mL/h via INTRAVENOUS
  Filled 2018-07-14 (×3): qty 1000

## 2018-07-14 MED ORDER — SODIUM CHLORIDE 0.9 % IV SOLN: Freq: Once | INTRAVENOUS | Status: DC

## 2018-07-14 MED ORDER — IPRATROPIUM-ALBUTEROL 0.5-2.5 (3) MG/3ML IN SOLN: 3.0000 mL | Freq: Four times a day (QID) | RESPIRATORY_TRACT | Status: DC | PRN

## 2018-07-14 MED ORDER — MAGNESIUM SULFATE 2 GM/50ML IV SOLN
2.0000 g | Freq: Once | INTRAVENOUS | Status: AC
  Administered 2018-07-14: 2 g via INTRAVENOUS
  Filled 2018-07-14: qty 50

## 2018-07-14 MED ORDER — ACETAMINOPHEN 650 MG RE SUPP: 650.0000 mg | Freq: Four times a day (QID) | RECTAL | Status: DC | PRN

## 2018-07-14 MED ORDER — PANTOPRAZOLE SODIUM 40 MG PO TBEC
40.0000 mg | DELAYED_RELEASE_TABLET | Freq: Two times a day (BID) | ORAL | Status: DC
  Administered 2018-07-14 – 2018-07-18 (×9): 40 mg via ORAL
  Filled 2018-07-14 (×9): qty 1

## 2018-07-14 MED ORDER — INSULIN ASPART 100 UNIT/ML ~~LOC~~ SOLN
0.0000 [IU] | Freq: Three times a day (TID) | SUBCUTANEOUS | Status: DC
  Administered 2018-07-14: 2 [IU] via SUBCUTANEOUS
  Administered 2018-07-15 (×2): 1 [IU] via SUBCUTANEOUS
  Administered 2018-07-18: 2 [IU] via SUBCUTANEOUS

## 2018-07-14 MED ORDER — POTASSIUM CHLORIDE CRYS ER 10 MEQ PO TBCR: 10.0000 meq | EXTENDED_RELEASE_TABLET | Freq: Every day | ORAL | Status: DC

## 2018-07-14 NOTE — ED Notes (Signed)
Bladder scanned patient over 

## 2018-07-14 NOTE — ED Provider Notes (Signed)
[facility] EMERGENCY DEPARTMENT Provider Note   CSN: [id number] Arrival date & time: 07/14/18  0137     History   Chief Complaint Chief Complaint  Patient presents with  . Urinary Retention  . Rectal Bleeding    HPI Eric Calderon is a 77 y.o. male with a hx of fibrillation, chronic anticoagulation, coronary artery disease, COPD, non-insulin-dependent diabetes, hypertension, GI bleed presents to the Emergency Department complaining of gradual, persistent, progressively worsening abdominal pain, urinary retention, tarry stools and increasing weakness onset 8 PM last night.  He reports severe, 10/10 lower abdominal pain with inability to urinate.  He reports some dribbling.  He denies hematuria.  He denies history of urinary tract infection.  Patient denies previous history of urinary retention.  He reports recurrent falls due to his weakness.  He denies hitting his head or loss of consciousness.  He denies chest pain or shortness of breath.  Additionally, patient reports bloody stools onset again yesterday.  Patient also reports he was recently taken off his Coumadin for a GI bleed and placed on Eliquis.  Record review confirms this.  He was discharged on 7/19 after GI bleed on Coumadin.     The history is provided by the patient and medical records. No language interpreter was used.    Past Medical History:  Diagnosis Date  . Atrial fibrillation with rapid ventricular response (HCC)   . CAD S/P percutaneous coronary angioplasty October 2006   CATH: 80% MID LAD, involving diagonal bifurcation; 99%  AV GROOVE  CIRCUMFLEX --> PCI of circumflex with 3.0 mm 13 and a Cypher DES; planned staged LAD PCI  . CAD, multiple vessel November 2007   LAD lesion persists, 80% pre-D1 with 60% D1 and distal down the one lesion. 70% ISR and circumflex, L. PDA 80%; nondominant RCA - diffuse 80-70%  . Chronic atrial fibrillation (HCC)    Rate control with beta blocker, anticoagulated  warfarin. This patients CHA2DS2-VASc Score and unadjusted Ischemic Stroke Rate (% per year) is equal to 3.2 % stroke rate/year from a score of 3  . COPD (chronic obstructive pulmonary disease) (HCC)    Greater than 100 pack year smoking history, quit 2009  . Diabetes mellitus without complication (HCC)    2012  . Essential hypertension   . Exertional dyspnea Since 2006   a) 8/'11 Myoview: No Ischemic/Infarct; Echo: Nl LV size & fxn, EF 55-60%, mod-Severe LA & RA dilation,mild AoV sclerosis, Mild PHTN;; 4/'14 Echo: LV EF  55-60%, aortic sclerosis, moderate MR, moderate to severe LA and RA dilation, increased RV pressure -mild PHtn; 9/'15: Lexiscan Myovew: No Ischmia or Infarct, Afib - no EF.  . GI bleed   . Hyperlipidemia with target LDL less than 70   . Mild mitral regurgitation by prior echocardiography 03/2013   Echo 2019:  Mild MR  . Rib fracture   . 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 Supratherapeutic INR   . Symptomatic anemia     Patient Active Problem List   Diagnosis Date Noted  . Acute urinary retention 07/14/2018  . Gross hematuria 07/14/2018  . Malnutrition of moderate degree 07/10/2018  . Atrial fibrillation with rapid ventricular response (HCC) 07/08/2018  . Supratherapeutic INR 07/08/2018  . Elevated troponin 07/08/2018  . GI bleeding 07/08/2018  . Metabolic acidosis 07/08/2018  . Acute kidney injury (HCC) 07/08/2018  . Leukocytosis 07/08/2018  . Diabetes mellitus without complication (HCC)   . Symptomatic anemia   .  COPD (chronic obstructive pulmonary disease) (HCC)   . Rib fracture   . GI bleed   . Post-nasal drip 04/04/2014  . Emphysema lung (HCC) 02/06/2014  . Moderate mitral regurgitation 02/06/2014  . 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 Chronic atrial fibrillation (HCC); CHA2DS2-VASc Score - 4  03/11/2013  . Long term current use of anticoagulant therapy 03/11/2013  . Exertional dyspnea 10/04/2008  . Hyperlipidemia with target LDL less than 70 10/01/2008  . Essential hypertension 10/01/2008    Past Surgical History:  Procedure Laterality Date  . CARDIAC CATHETERIZATION  2007   CONCLUSION SIGNIFICANT THREE VESSEL CAD  2.NORMAL LEFT VENTRICULAR SYSTOLIC FUNCTION  . CORONARY ARTERY BYPASS GRAFT  2008   x4  . ESOPHAGOGASTRODUODENOSCOPY (EGD) WITH PROPOFOL N/A 07/09/2018   Procedure: ESOPHAGOGASTRODUODENOSCOPY (EGD) WITH PROPOFOL;  Surgeon: Vida Rigger, MD;  Location: [facility] ENDOSCOPY;  Service: Endoscopy;  Laterality: N/A;  . NM MYOVIEW LTD  September 2015   Non-gated. Low risk. No ischemia or infarct  . TRANSTHORACIC ECHOCARDIOGRAM  04/02/2013   EF 55-60%. No regional W. May. Moderate MR, moderate to severely dilated LA. Mild RV pressure increase with moderate RA dilation. Moderate TR.  [name] TRANSTHORACIC ECHOCARDIOGRAM  05/2018   Normal LV size.  Moderate LVH.  EF 55-60% with no regional wall motion abnormality.  Unable to assess diastolic function.  Severe biatrial enlargement.  Mild MR        Home Medications    Prior to Admission medications   Medication Sig Start Date End Date Taking? Authorizing Provider  allopurinol (ZYLOPRIM) 100 MG tablet Take 1 tablet by mouth daily as needed (for gout).  07/19/13   [provider]  apixaban (ELIQUIS) 5 MG TABS tablet Take 1 tablet (5 mg total) by mouth 2 (two) times daily. 07/11/18   Haydee Monica, MD  fenofibrate 160 MG tablet Take 1 tablet by mouth daily. 06/01/13   [provider]  furosemide (LASIX) 40 MG tablet TAKE 1 TABLET(40 MG) BY MOUTH DAILY 06/03/18   Marykay Lex, MD  glipiZIDE (GLUCOTROL XL) 10 MG 24 hr tablet Take by mouth daily with breakfast.  07/30/14   [provider]  indomethacin (INDOCIN SR) 75 MG CR capsule Take 75 mg by mouth 2 (two) times daily as needed for mild pain (gout symptoms).      [provider]  JANUMET XR [phone] MG TB24 Take 1 tablet by mouth daily. 03/27/16   [provider]  LANCETS SUPER THIN 28G MISC  01/26/14   [provider]  metoprolol tartrate (LOPRESSOR) 25 MG tablet Take 0.5 tablets (12.5 mg total) by mouth 2 (two) times daily. 06/03/18   Marykay Lex, MD  potassium chloride (K-DUR,KLOR-CON) 10 MEQ tablet Take 1 tablet (10 mEq total) by mouth daily. 06/03/18   Marykay Lex, MD  simvastatin (ZOCOR) 40 MG tablet TAKE 1 TABLET(40 MG) BY MOUTH AT BEDTIME 05/31/17   Marykay Lex, MD  TRUETEST TEST test strip Inject 1 strip into the skin daily. Use 1 test strip each to check glucose daily 03/09/15   [provider]    Family History Family History  Problem Relation Age of Onset  . Stroke Mother   . Cancer Father     Social History Social History   Tobacco Use  . Smoking status: Former Smoker    Packs/day: 2.00  Years: 55.00    Pack years: 110.00    Last attempt to quit: 07/29/2007    Years since quitting: 10.9  . Smokeless tobacco: Never Used  Substance Use Topics  . Alcohol use: Yes    Alcohol/week: 16.8 oz    Types: 28 Cans of beer per week  . Drug use: No     Allergies   Niaspan [niacin er]   Review of Systems Review of Systems  Constitutional: Negative for appetite change, diaphoresis, fatigue, fever and unexpected weight change.  HENT: Negative for mouth sores.   Eyes: Negative for visual disturbance.  Respiratory: Negative for cough, chest tightness, shortness of breath and wheezing.   Cardiovascular: Negative for chest pain.  Gastrointestinal: Positive for abdominal pain and blood in stool. Negative for constipation, diarrhea, nausea and vomiting.  Endocrine: Negative for polydipsia, polyphagia and polyuria.  Genitourinary: Positive for difficulty urinating. Negative for dysuria, frequency, hematuria and urgency.  Musculoskeletal: Negative for back pain and neck stiffness.  Skin: Negative  for rash.  Allergic/Immunologic: Negative for immunocompromised state.  Neurological: Positive for weakness. Negative for syncope, light-headedness and headaches.  Hematological: Does not bruise/bleed easily.  Psychiatric/Behavioral: Negative for sleep disturbance. The patient is not nervous/anxious.      Physical Exam Updated Vital Signs BP 128/68   Pulse 94   Temp 99.6 F (37.6 C) (Rectal)   Resp (!) 23   Ht 5\' 8"  (1.727 m)   Wt 65.8 kg (145 lb)   SpO2 100%   BMI 22.05 kg/m   Physical Exam  Constitutional: He appears well-developed and well-nourished. No distress.  Awake, alert, nontoxic appearance  HENT:  Head: Normocephalic and atraumatic.  Mouth/Throat: Oropharynx is clear and moist. No oropharyngeal exudate.  Eyes: Conjunctivae are normal. No scleral icterus.  Neck: Normal range of motion. Neck supple.  Cardiovascular: Intact distal pulses. An irregularly irregular rhythm present. Tachycardia present.  Pulses:      Radial pulses are 2+ on the right side, and 2+ on the left side.  Pulmonary/Chest: Effort normal and breath sounds normal. No respiratory distress. He has no wheezes.  Equal chest expansion  Abdominal: Soft. Bowel sounds are normal. He exhibits distension. He exhibits no mass. There is tenderness in the right lower quadrant, periumbilical area, suprapubic area and left lower quadrant. There is guarding. There is no rebound.  Significantly distended demented with bladder palpable at the umbilicus.  Tenderness to palpation.  Genitourinary: Rectum normal.  Genitourinary Comments: Chaperone present.  Gross, black tarry stool on DRE. Stage I pressure ulcer to the sacrum, buttocks.  Musculoskeletal: Normal range of motion. He exhibits no edema.  Neurological: He is alert.  Speech is clear and goal oriented Moves extremities without ataxia  Skin: Skin is warm and dry. He is not diaphoretic.  Numerous, very large bruises over most of his body.  Psychiatric: He  has a normal mood and affect.  Nursing note and vitals reviewed.    ED Treatments / Results  Labs (all labs ordered are listed, but only abnormal results are displayed) Labs Reviewed  COMPREHENSIVE METABOLIC PANEL - Abnormal; Notable for the following components:      Result Value   Sodium 128 (*)    Potassium 3.1 (*)    Chloride 95 (*)    Glucose, Bld 177 (*)    Calcium 7.6 (*)    Total Protein 6.0 (*)    Albumin 1.9 (*)    AST 70 (*)    Total Bilirubin 3.0 (*)  GFR calc non Af Amer 60 (*)    All other components within normal limits  CBC WITH DIFFERENTIAL/PLATELET - Abnormal; Notable for the following components:   WBC 15.5 (*)    RBC 3.30 (*)    Hemoglobin 9.8 (*)    HCT 29.3 (*)    Platelets 628 (*)    Neutro Abs 13.3 (*)    Abs Immature Granulocytes 0.2 (*)    All other components within normal limits  PROTIME-INR - Abnormal; Notable for the following components:   Prothrombin Time 26.4 (*)    All other components within normal limits  URINALYSIS, ROUTINE W REFLEX MICROSCOPIC - Abnormal; Notable for the following components:   Color, Urine RED (*)    APPearance TURBID (*)    Glucose, UA >=500 (*)    Hgb urine dipstick LARGE (*)    Bilirubin Urine MODERATE (*)    Ketones, ur 15 (*)    Protein, ur >300 (*)    Nitrite POSITIVE (*)    Leukocytes, UA MODERATE (*)    All other components within normal limits  URINALYSIS, MICROSCOPIC (REFLEX) - Abnormal; Notable for the following components:   Bacteria, UA FEW (*)    All other components within normal limits  POC OCCULT BLOOD, ED - Abnormal; Notable for the following components:   Fecal Occult Bld POSITIVE (*)    All other components within normal limits  CULTURE, BLOOD (ROUTINE X 2)  CULTURE, BLOOD (ROUTINE X 2)  URINE CULTURE  I-STAT CG4 LACTIC ACID, ED  I-STAT CG4 LACTIC ACID, ED    EKG EKG Interpretation  Date/Time:  Monday July 14 2018 03:25:35 EDT Ventricular Rate:  125 PR Interval:    QRS  Duration: 101 QT Interval:  339 QTC Calculation: 489 R Axis:   68 Text Interpretation:  Atrial fibrillation Ventricular premature complex Repol abnrm suggests ischemia, anterolateral Interpretation limited secondary to artifact Confirmed by Zadie Rhine ([id number]) on 07/14/2018 3:30:37 AM   Radiology Dg Chest 2 View  Result Date: 07/14/2018 CLINICAL DATA:  Fever. EXAM: CHEST - 2 VIEW COMPARISON:  07/08/2018 FINDINGS: Postoperative changes in the mediastinum. Heart size and pulmonary vascularity are normal. Small bilateral pleural effusions, greater on the left. Basilar atelectasis. No focal consolidation. No pneumothorax. Mediastinal contours appear intact. IMPRESSION: Small bilateral pleural effusions and basilar atelectasis, greater on the left. Electronically Signed   By: Burman Nieves M.D.   On: 07/14/2018 02:16    Procedures Procedures (including critical care time)  Medications Ordered in ED Medications  0.9 %  sodium chloride infusion (has no administration in time range)  cefTRIAXone (ROCEPHIN) 1 g in sodium chloride 0.9 % 100 mL IVPB (0 g Intravenous Stopped 07/14/18 0546)     Initial Impression / Assessment and Plan / ED Course  I have reviewed the triage vital signs and the nursing notes.  Pertinent labs & imaging results that were available during my care of the patient were reviewed by me and considered in my medical decision making (see chart for details).  Clinical Course as of Jul 14 738  Mon Jul 14, 2018  0722 Discussed with   [HM]  0727 hypokalemia  Potassium(!): 3.1 [HM]  0727 Anemia  Hemoglobin(!): 9.8 [HM]  0727 WBC(!): 15.5 [HM]  0733 UTI  Nitrite(!): POSITIVE [HM]  0737 Fecal Occult Blood, POC(!): POSITIVE [HM]  0737 bilateral pleural effusions  DG Chest 2 View [HM]    Clinical Course User Index [HM] Emonte Dieujuste, Boyd Kerbs    Presents with acute  urinary retention.  Foley catheter placed with evidence of gross hematuria and urinary tract  infection.  Concern for hemorrhagic cystitis.  Suspect this was the reason for his urinary retention.  Rocephin given.  Repeat abdominal exam is significantly improved after Foley catheter insertion.  Now soft and nontender.  DRE with gross tarry stools.  Concern for persistent GI bleed.  He is tachycardic.  Known history of A. Fib.  Currently anticoagulated on Eliquis.    Patient is generally weak with hemorrhagic cystitis and ongoing GI bleed with long-term anticoagulation.  He will need admission for monitoring.  Discussed with triad hospitalist who will admit.  Final Clinical Impressions(s) / ED Diagnoses   Final diagnoses:  Urinary retention  Rectal bleeding  Hemorrhagic cystitis  Chronic atrial fibrillation [facility](HCC)    ED Discharge Orders    None       Milta DeitersMuthersbaugh, Shandi Godfrey, PA-C 07/14/18 0739    Gilda CreasePollina, Christopher J, MD 07/14/18 [phone]

## 2018-07-14 NOTE — ED Notes (Signed)
Pt given tissues and call bell, states ready for bed

## 2018-07-14 NOTE — Clinical Social Work Note (Signed)
Clinical Social Work Assessment  Patient Details  Name: Eric MorseRobert R Calderon MRN: [id number] Date of Birth: 1941/12/16  Date of referral:  07/14/18               Reason for consult:  Discharge Planning                Permission sought to share information with:  Facility Medical sales representativeContact Representative, Family Supports Permission granted to share information::  Yes, Verbal Permission Granted  Name::        Agency::  [facility]  Relationship::  Daughter  Contact Information:     Housing/Transportation Living arrangements for the past 2 months:  Mobile Home, Skilled Nursing Facility Source of Information:  Patient Patient Interpreter Needed:  None Criminal Activity/Legal Involvement Pertinent to Current Situation/Hospitalization:  No - Comment as needed Significant Relationships:  Adult Children, Neighbor Lives with:  Self Do you feel safe going back to the place where you live?  No Need for family participation in patient care:  No (Coment)  Care giving concerns:  CSW received consult regarding discharge plan. [facility] was [facility]. Patient reported that he was supposed to begin rehab Monday but ended up back at the hospital. CSW to continue to follow and assist with discharge planning needs.   Social Worker assessment / plan:  CSW spoke with patient concerning possibility of [facility] before returning home.  Employment status:  Retired Health and safety inspectornsurance information:  Medicare PT Recommendations:  Not assessed at this time Information / Referral to community resources:  Skilled Nursing Facility  Patient/Family's Response to care:  Patient recognizes need for rehab before returning home and is agreeable to return to [facility] if he really needs it.   Patient/Family's Understanding of and Emotional Response to Diagnosis, Current Treatment, and Prognosis:  Patient/family is realistic regarding therapy needs and expressed being hopeful for return to SNF  placement so that he can receive rehab and return home. Patient expressed understanding of CSW role and discharge process as well as medical condition. No questions/concerns about plan or treatment.    Emotional Assessment Appearance:  Appears stated age Attitude/Demeanor/Rapport:  Engaged Affect (typically observed):  Accepting, Appropriate Orientation:  Oriented to Place, Oriented to Self, Oriented to  Time, Oriented to Situation Alcohol / Substance use:  Not Applicable Psych involvement (Current and /or in the community):  No (Comment)  Discharge Needs  Concerns to be addressed:  Care Coordination Readmission within the last 30 days:  Yes Current discharge risk:  Lives alone Barriers to Discharge:  Continued Medical Work up   [employer] Eric Calderon, Theresia MajorsLCSWA 07/14/2018, 5:37 PM

## 2018-07-14 NOTE — ED Provider Notes (Signed)
Patient presented to the ER with urinary retention.  Patient reports that he had been urinating quite frequently up until tonight and then he started only "dribbling"  Face to face Exam: HEENT - PERRLA Lungs - CTAB Heart -irregularly irregular, no M/R/G Abd - S/NT/ND Neuro - alert, oriented x3  Plan: Patient with urinary obstruction, Foley catheter placed.  Urinalysis suggestive of infection.  Initiate empiric antibiotics.   Gilda CreasePollina, Christopher J, MD 07/14/18 [phone]

## 2018-07-14 NOTE — [place] FL2 (Signed)
MEDICAID FL2 LEVEL OF CARE SCREENING TOOL     IDENTIFICATION  Patient Name: Eric Calderon Birthdate: 11-Oct-1941 Sex: male Admission Date (Current Location): 07/14/2018  [facility] and [place] Number:  Producer, television/film/video and Address:  [facility], [address] [place], [place] [address]      Provider Number: [id number]  Attending Physician Name and Address:  Richarda Overlie, MD  Relative Name and Phone Number:       Current Level of Care: Hospital Recommended Level of Care: Skilled Nursing Facility Prior Approval Number:    Date Approved/Denied:   PASRR Number:    Discharge Plan: SNF    Current Diagnoses: Patient Active Problem List   Diagnosis Date Noted  . Acute urinary retention 07/14/2018  . Gross hematuria 07/14/2018  . Urine retention 07/14/2018  . Malnutrition of moderate degree 07/10/2018  . Atrial fibrillation with rapid ventricular response (HCC) 07/08/2018  . Supratherapeutic INR 07/08/2018  . Elevated troponin 07/08/2018  . GI bleeding 07/08/2018  . Metabolic acidosis 07/08/2018  . Acute kidney injury (HCC) 07/08/2018  . Leukocytosis 07/08/2018  . Diabetes mellitus without complication (HCC)   . Symptomatic anemia   . COPD (chronic obstructive pulmonary disease) (HCC)   . Rib fracture   . GI bleed   . Post-nasal drip 04/04/2014  . Emphysema lung (HCC) 02/06/2014  . Moderate mitral regurgitation 02/06/2014  . 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 Chronic atrial fibrillation (HCC); CHA2DS2-VASc Score - 4 03/11/2013  . Long term current use of anticoagulant therapy 03/11/2013  . Exertional dyspnea 10/04/2008  . Hyperlipidemia with target LDL less than 70 10/01/2008  . Essential hypertension 10/01/2008    Orientation RESPIRATION BLADDER Height & Weight     Self, Time, Situation, Place  Normal  Incontinent, Indwelling catheter Weight: 145 lb 1 oz (65.8 kg) Height:  5\' 8"  (172.7 cm)  BEHAVIORAL SYMPTOMS/MOOD NEUROLOGICAL BOWEL NUTRITION STATUS      Continent Diet(see DC summary)  AMBULATORY STATUS COMMUNICATION OF NEEDS Skin   Limited Assist Verbally Normal                       Personal Care Assistance Level of Assistance  Bathing, Dressing Bathing Assistance: Limited assistance   Dressing Assistance: Limited assistance     Functional Limitations Info             SPECIAL CARE FACTORS FREQUENCY  PT (By licensed PT), OT (By licensed OT)     PT Frequency: 5/wk OT Frequency: 5/2k            Contractures      Additional Factors Info  Code Status, Allergies, Insulin Sliding Scale Code Status Info: DNR Allergies Info: Niaspan Niacin Er   Insulin Sliding Scale Info: 3/day       Current Medications (07/14/2018):  This is the current hospital active medication list Current Facility-Administered Medications  Medication Dose Route Frequency Provider Last Rate Last Dose  . 0.9 % NaCl with KCl 40 mEq / L  infusion   Intravenous Continuous Marcos Eke, PA-C 75 mL/hr at 07/14/18 1439    . acetaminophen (TYLENOL) tablet 650 mg  650 mg Oral Q6H PRN Marlowe Kays E, PA-C       Or  . acetaminophen (TYLENOL) suppository 650 mg  650 mg Rectal Q6H PRN Marlowe Kays  E, PA-C      . allopurinol (ZYLOPRIM) tablet 100 mg  100 mg Oral Daily PRN Marcos EkeWertman, Sara E, PA-C      . bisacodyl (DULCOLAX) EC tablet 5 mg  5 mg Oral Daily PRN Marcos EkeWertman, Sara E, PA-C      . [START ON 07/15/2018] cefTRIAXone (ROCEPHIN) 1 g in sodium chloride 0.9 % 100 mL IVPB  1 g Intravenous Q24H Wertman, Sara E, PA-C      . fenofibrate tablet 160 mg  160 mg Oral Daily Marlowe KaysWertman, Sara E, PA-C   160 mg at 07/14/18 1049  . HYDROcodone-acetaminophen (NORCO/VICODIN) 5-325 MG per tablet 1-2 tablet  1-2 tablet Oral Q4H PRN Marcos EkeWertman, Sara E, PA-C      . insulin aspart (novoLOG) injection 0-9 Units  0-9 Units  Subcutaneous TID WC Wertman, Sara E, PA-C      . ipratropium-albuterol (DUONEB) 0.5-2.5 (3) MG/3ML nebulizer solution 3 mL  3 mL Nebulization Q6H PRN Marcos EkeWertman, Sara E, PA-C      . metoprolol tartrate (LOPRESSOR) tablet 25 mg  25 mg Oral BID Richarda OverlieAbrol, Nayana, MD   25 mg at 07/14/18 1252  . pantoprazole (PROTONIX) EC tablet 40 mg  40 mg Oral BID Richarda OverlieAbrol, Nayana, MD   40 mg at 07/14/18 1049  . polyethylene glycol (MIRALAX / GLYCOLAX) packet 17 g  17 g Oral Daily PRN Marcos EkeWertman, Sara E, PA-C      . simvastatin (ZOCOR) tablet 40 mg  40 mg Oral q1800 Marlowe KaysWertman, Sara E, PA-C      . white petrolatum (VASELINE) gel              Discharge Medications: Please see discharge summary for a list of discharge medications.  Relevant Imaging Results:  Relevant Lab Results:   Additional Information SSN: [id number]  Niguel Moure, Two StrikeJenna H, [place]

## 2018-07-14 NOTE — Progress Notes (Signed)
Patient transferred from ED with RN. Placed on tele monitor, CCMD called. Patient awake, oriented and vitals WNL. Son at bedside, both oriented to unit and call light. Will continue to monitor.

## 2018-07-14 NOTE — ED Triage Notes (Signed)
Pt BIB [facility] for eval of urinary retention/ "dribbling" per pt as well as ? Of GI bleeding. SNF staff reports pt had 2 bloody BMs at facility this evening w/ BRBPR. Pt reports he is not able to pass urine as he normally is and states he is just "dribbling". Pt arrives in afib w/ hx of same, anticoagulated on eliquis. No hx of trauma

## 2018-07-14 NOTE — H&P (Signed)
History and Physical    Eric Calderon:[id number] DOB: [birth year] DOA: 07/14/2018   PCP: Marva Panda, NP   Patient coming from:  IP rehab  Chief Complaint: Lower abdominal pain   HPI: Eric Calderon is a 77 y.o. male with medical history significant for atrial fibrillation recently on Coumadin, currently not on anticoagulation due to recent history of GI bleed requiring hospitalization, with  upper endoscopy on 07/09/2018 remarkable for small ulcers and barret's esophagus, but requiring 4 units of blood, CAD status post CABG x4 in 2008, hyperlipidemia, hypertension, diabetes, COPD not on home oxygen,brought from inpatient rehab, with progressive abdominal swelling and pain, urinary retention, and increasing weakness since 8 PM last night.  No confusion was noted.  He reports the pain being severe, 10 out of 10, and as he was trying to urinate, he will not pass urine.  He also reported bloody stools yesterday, tarry, although he believes that these may have been a sequela from his recent Coumadin use.  He has transitioned to Eliquis as of 07/10/2018.  He denies any nausea or vomiting.  He has been increasingly weak, having recurrent falls, but he denies hitting his head, loss of consciousness, dizziness, vertigo, syncope or presyncope.  He denies any chest pain or palpitations, or shortness of breath or cough.  He denies any fever or chills or night sweats.  He reports that this is the first time that he has urine retention.   ED Course:  BP (!) 118/58   Pulse (!) 110   Temp 99.6 F (37.6 C) (Rectal)   Resp 20   Ht 5\' 8"  (1.727 m)   Wt 65.8 kg (145 lb)   SpO2 100%   BMI 22.05 kg/m   In the ED, Foley catheter was placed, with evidence of gross hematuria, with some clots noted.  About 1000 cc of urine where seen, and the patient experienced immediate relief of symptoms.  In fact, he began to urinate on his without significant issues.  He also was given Rocephin after cultures were  drawn, and IV fluids UA was positive for nitrites White count 15.5 Hemoccult positive Potassium 3.1, was replenished   Review of Systems:  As per HPI otherwise all other systems reviewed and are negative  Past Medical History:  Diagnosis Date  . Atrial fibrillation with rapid ventricular response (HCC)   . CAD S/P percutaneous coronary angioplasty October 2006   CATH: 80% MID LAD, involving diagonal bifurcation; 99%  AV GROOVE  CIRCUMFLEX --> PCI of circumflex with 3.0 mm 13 and a Cypher DES; planned staged LAD PCI  . CAD, multiple vessel November 2007   LAD lesion persists, 80% pre-D1 with 60% D1 and distal down the one lesion. 70% ISR and circumflex, L. PDA 80%; nondominant RCA - diffuse 80-70%  . Chronic atrial fibrillation (HCC)    Rate control with beta blocker, anticoagulated warfarin. This patients CHA2DS2-VASc Score and unadjusted Ischemic Stroke Rate (% per year) is equal to 3.2 % stroke rate/year from a score of 3  . COPD (chronic obstructive pulmonary disease) (HCC)    Greater than 100 pack year smoking history, quit 2009  . Diabetes mellitus without complication (HCC)    2012  . Essential hypertension   . Exertional dyspnea Since 2006   a) 8/'11 Myoview: No Ischemic/Infarct; Echo: Nl LV size & fxn, EF 55-60%, mod-Severe LA & RA dilation,mild AoV sclerosis, Mild PHTN;; 4/'14 Echo: LV EF  55-60%, aortic sclerosis, moderate MR, moderate to severe LA  and RA dilation, increased RV pressure -mild PHtn; 9/'15: Lexiscan Myovew: No Ischmia or Infarct, Afib - no EF.  . GI bleed   . Hyperlipidemia with target LDL less than 70   . Mild mitral regurgitation by prior echocardiography 03/2013   Echo 2019:  Mild MR  . Rib fracture   . 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 Supratherapeutic INR   . Symptomatic anemia     Past Surgical History:  Procedure Laterality Date  . CARDIAC CATHETERIZATION  2007   CONCLUSION SIGNIFICANT THREE VESSEL CAD   2.NORMAL LEFT VENTRICULAR SYSTOLIC FUNCTION  . CORONARY ARTERY BYPASS GRAFT  2008   x4  . ESOPHAGOGASTRODUODENOSCOPY (EGD) WITH PROPOFOL N/A 07/09/2018   Procedure: ESOPHAGOGASTRODUODENOSCOPY (EGD) WITH PROPOFOL;  Surgeon: Vida Rigger, MD;  Location: [facility] ENDOSCOPY;  Service: Endoscopy;  Laterality: N/A;  . NM MYOVIEW LTD  September 2015   Non-gated. Low risk. No ischemia or infarct  . TRANSTHORACIC ECHOCARDIOGRAM  04/02/2013   EF 55-60%. No regional W. May. Moderate MR, moderate to severely dilated LA. Mild RV pressure increase with moderate RA dilation. Moderate TR.  [name] TRANSTHORACIC ECHOCARDIOGRAM  05/2018   Normal LV size.  Moderate LVH.  EF 55-60% with no regional wall motion abnormality.  Unable to assess diastolic function.  Severe biatrial enlargement.  Mild MR    Social History Social History   Socioeconomic History  . Marital status: Single    Spouse name: Not on file  . Number of children: Not on file  . Years of education: Not on file  . Highest education level: Not on file  Occupational History  . Not on file  Social Needs  . Financial resource strain: Not on file  . Food insecurity:    Worry: Not on file    Inability: Not on file  . Transportation needs:    Medical: Not on file    Non-medical: Not on file  Tobacco Use  . Smoking status: Former Smoker    Packs/day: 2.00    Years: 55.00    Pack years: 110.00    Last attempt to quit: 07/29/2007    Years since quitting: 10.9  . Smokeless tobacco: Never Used  Substance and Sexual Activity  . Alcohol use: Yes    Alcohol/week: 16.8 oz    Types: 28 Cans of beer per week  . Drug use: No  . Sexual activity: Not on file  Lifestyle  . Physical activity:    Days per week: Not on file    Minutes per session: Not on file  . Stress: Not on file  Relationships  . Social connections:    Talks on phone: Not on file    Gets together: Not on file    Attends religious service: Not on file    Active member of club or  organization: Not on file    Attends meetings of clubs or organizations: Not on file    Relationship status: Not on file  . Intimate partner violence:    Fear of current or ex partner: Not on file    Emotionally abused: Not on file    Physically abused: Not on file    Forced sexual activity: Not on file  Other Topics Concern  . Not on file  Social History Narrative   He is a divorced father 3, grandfather of 3. He enjoys walking on the treadmill for roughly 30 minutes at a time daily. No symptoms of that.  He also while systolic daily. He doesn't drink alcohol and quit smoking years ago.     Allergies  Allergen Reactions  . Niaspan [Niacin Er]     Family History  Problem Relation Age of Onset  . Stroke Mother   . Cancer Father        Prior to Admission medications   Medication Sig Start Date End Date Taking? Authorizing Provider  allopurinol (ZYLOPRIM) 100 MG tablet Take 1 tablet by mouth daily as needed (for gout).  07/19/13   [provider]  apixaban (ELIQUIS) 5 MG TABS tablet Take 1 tablet (5 mg total) by mouth 2 (two) times daily. 07/11/18   Haydee Monica, MD  fenofibrate 160 MG tablet Take 1 tablet by mouth daily. 06/01/13   [provider]  furosemide (LASIX) 40 MG tablet TAKE 1 TABLET(40 MG) BY MOUTH DAILY 06/03/18   Marykay Lex, MD  glipiZIDE (GLUCOTROL XL) 10 MG 24 hr tablet Take by mouth daily with breakfast.  07/30/14   [provider]  indomethacin (INDOCIN SR) 75 MG CR capsule Take 75 mg by mouth 2 (two) times daily as needed for mild pain (gout symptoms).     [provider]  JANUMET XR [phone] MG TB24 Take 1 tablet by mouth daily. 03/27/16   [provider]  LANCETS SUPER THIN 28G MISC  01/26/14   [provider]  metoprolol tartrate (LOPRESSOR) 25 MG tablet Take 0.5 tablets (12.5 mg total) by mouth 2 (two) times daily. 06/03/18   Marykay Lex, MD  potassium chloride (K-DUR,KLOR-CON) 10 MEQ tablet Take 1 tablet  (10 mEq total) by mouth daily. 06/03/18   Marykay Lex, MD  simvastatin (ZOCOR) 40 MG tablet TAKE 1 TABLET(40 MG) BY MOUTH AT BEDTIME 05/31/17   Marykay Lex, MD  TRUETEST TEST test strip Inject 1 strip into the skin daily. Use 1 test strip each to check glucose daily 03/09/15   [provider]     Physical Exam:  Vitals:   07/14/18 0545 07/14/18 0600 07/14/18 0645 07/14/18 0700  BP: 121/61 128/68 114/63 (!) 118/58  Pulse: (!) 108 94 (!) 106 (!) 110  Resp: 18 (!) 23 (!) 21 20  Temp:      TempSrc:      SpO2: 99% 100% 98% 100%  Weight:      Height:       Constitutional: NAD, calm, comfortable, chronically ill-appearing Eyes: PERRL, lids and conjunctivae normal ENMT: Mucous membranes are moist, without exudate or lesions  Neck: normal, supple, no masses, no thyromegaly Respiratory: clear to auscultation bilaterally, no wheezing, no crackles. Normal respiratory effort  Cardiovascular: Mildly tachycardic, irregular rate and rhythm, very soft 1 out of 6 murmur, rubs or gallops. No extremity edema. 2+ pedal pulses. No carotid bruits.  Abdomen: Soft, non tender, No hepatosplenomegaly. Bowel sounds positive.  Musculoskeletal: no clubbing / cyanosis. Moves all extremities Skin: no jaundice, No lesions.  Neurologic: Sensation intact  Strength equal in all extremities Psychiatric:   Alert and oriented x 3. Normal mood.     Labs on Admission: I have personally reviewed following labs and imaging studies  CBC: Recent Labs  Lab 07/09/18 0920 07/10/18 0522 07/11/18 0445 07/12/18 0542 07/14/18 0239  WBC 17.8* 15.9* 14.0* 13.6* 15.5*  NEUTROABS  --   --   --   --  13.3*  HGB 8.3* 8.5* 7.8* 8.8* 9.8*  HCT 24.3* 25.5* 23.5* 26.9* 29.3*  MCV 86.5 89.2 91.1 89.1 88.8  PLT 328 384 429* 441* 628*    Basic Metabolic Panel: Recent Labs  Lab 07/08/18 0729  07/09/18 0920 07/10/18 0522 07/11/18 0445 07/12/18 0542 07/14/18 0239  NA 131*   < > 139 139 135 130* 128*  K 4.0    < > 3.3* 3.3* 3.5 3.7 3.1*  CL 96*   < > 109 111 105 103 95*  CO2 12*   < > 21* 21* 23 23 24   GLUCOSE 297*   < > 128* 84 91 112* 177*  BUN 101*   < > 62* 41* 26* 19 16  CREATININE 3.02*   < > 1.50* 1.29* 1.20 1.14 1.15  CALCIUM 8.3*   < > 7.8* 7.7* 7.8* 7.5* 7.6*  MG 2.5*  --   --  1.7 1.9  --   --    < > = values in this interval not displayed.    GFR: Estimated Creatinine Clearance: 50.9 mL/min (by C-G formula based on SCr of 1.15 mg/dL).  Liver Function Tests: Recent Labs  Lab 07/08/18 1031 07/14/18 0239  AST 50* 70*  ALT 20 34  ALKPHOS 36* 90  BILITOT 1.2 3.0*  PROT 4.7* 6.0*  ALBUMIN 1.9* 1.9*   No results for input(s): LIPASE, AMYLASE in the last 168 hours. No results for input(s): AMMONIA in the last 168 hours.  Coagulation Profile: Recent Labs  Lab 07/08/18 0820 07/08/18 2121 07/09/18 0920 07/14/18 0239  INR >10.00* 1.52 1.37 2.45    Cardiac Enzymes: Recent Labs  Lab 07/08/18 0729 07/08/18 1502 07/08/18 2121 07/09/18 0346 07/09/18 0920  TROPONINI 0.05* 0.25* 0.58* 0.43* 0.25*    BNP (last 3 results) No results for input(s): PROBNP in the last 8760 hours.  HbA1C: No results for input(s): HGBA1C in the last 72 hours.  CBG: Recent Labs  Lab 07/11/18 0814 07/11/18 1209 07/11/18 1727 07/11/18 2131 07/12/18 0802  GLUCAP 96 268* 118* 149* 115*    Lipid Profile: No results for input(s): CHOL, HDL, LDLCALC, TRIG, CHOLHDL, LDLDIRECT in the last 72 hours.  Thyroid Function Tests: No results for input(s): TSH, T4TOTAL, FREET4, T3FREE, THYROIDAB in the last 72 hours.  Anemia Panel: No results for input(s): VITAMINB12, FOLATE, FERRITIN, TIBC, IRON, RETICCTPCT in the last 72 hours.  Urine analysis:    Component Value Date/Time   COLORURINE RED (A) 07/14/2018 0244   APPEARANCEUR TURBID (A) 07/14/2018 0244   LABSPEC 1.015 07/14/2018 0244   PHURINE 6.5 07/14/2018 0244   GLUCOSEU >=500 (A) 07/14/2018 0244   HGBUR LARGE (A) 07/14/2018 0244    BILIRUBINUR MODERATE (A) 07/14/2018 0244   KETONESUR 15 (A) 07/14/2018 0244   PROTEINUR >300 (A) 07/14/2018 0244   NITRITE POSITIVE (A) 07/14/2018 0244   LEUKOCYTESUR MODERATE (A) 07/14/2018 0244    Sepsis Labs: @LABRCNTIP (procalcitonin:4,lacticidven:4) ) Recent Results (from the past 240 hour(s))  MRSA PCR Screening     Status: None   Collection Time: 07/08/18  6:10 PM  Result Value Ref Range Status   MRSA by PCR NEGATIVE NEGATIVE Final    Comment:        The GeneXpert MRSA Assay (FDA approved for NASAL specimens only), is one component of a comprehensive MRSA colonization surveillance program. It is not intended to diagnose MRSA infection nor to guide or monitor treatment for MRSA infections. Performed at [facility], [address] [place], [place] [address]      Radiological Exams on Admission: Dg Chest 2 View  Result Date: 07/14/2018 CLINICAL DATA:  Fever. EXAM:  CHEST - 2 VIEW COMPARISON:  07/08/2018 FINDINGS: Postoperative changes in the mediastinum. Heart size and pulmonary vascularity are normal. Small bilateral pleural effusions, greater on the left. Basilar atelectasis. No focal consolidation. No pneumothorax. Mediastinal contours appear intact. IMPRESSION: Small bilateral pleural effusions and basilar atelectasis, greater on the left. Electronically Signed   By: Burman Nieves M.D.   On: 07/14/2018 02:16    EKG: Independently reviewed.  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  Positive for nitrates, white count 15. In the ED, Foley catheter was placed, with evidence of gross hematuria, with some clots noted.  About 1000 cc of urine where seen, and the patient experienced immediate relief of symptoms.  In fact, he began to urinate on his without significant issues.  He also was given Rocephin after cultures were drawn, and IV fluids.  Telemetry observation Continue IV fluids, continue IV Rocephin Follow urine cultures CBC in a.m. If symptoms return, then will likely obtain urology consultation.  History of symptomatic acute blood loss anemia due to upper GI bleed, last EGD on 07/09/2018 shows minimal clot in the stomach, but no signs of active bleeding.  Barrett's esophagus was noted.. Hemoglobin is 9.8.  During that admission, the patient received 4 units of blood.  Today, he has positive Hemoccult, and he had 2 bowel movements yesterday with tarry stools.  However, no further gross hematochezia or melena is seen at the ED. Continue PPI Avoid anticoagulants  No indication for transfusion at this time. Follow CBC every 6 hours The patient is to follow-up with Dr. Ewing Schlein to discuss colonoscopy as an outpatient Avoid NSAIDs   Atrial Fibrillation CHA2DS2-VASc score 4, on anticoagulation with Eliquis since 7/18 INR 2.45 continue meds, but hold anticoagulants now due to above.  Last 2D echo May 28, 2018 with normal LV size, moderate LVH, EF 55 to 60%, severe biatrial enlargement, mild MR. SCDs No intervention at this time, unless chest pain develops  Hyponatremia  likely due to diuretics,  No neuro deficits but significant deconditioning.  He also has decreased p.o. intake.  SIADH is also a consideration given extensive tobacco history.  Sodium today is  128 Hold diuretics IV normal saline BMET every 8 hours Free water restriction of 1500 cc a day We will consider SIADH work-up if values do not improve. Cycle sodium to avoid correcting more than 10 mEq over 24 hours   Hypertension BP   118/58   Pulse  110    Continue home anti-hypertensive medications as tolerated, in view of soft blood pressure IV fluids in view of mild tachycardia/infection  Hyperlipidemia Continue home statins  Chronic kidney disease stage 3 current Cr is 1.15, stable.  Lab Results  Component Value Date   CREATININE 1.15 07/14/2018   CREATININE 1.14 07/12/2018   CREATININE 1.20  07/11/2018  Repeat BMET in am  Hold NSAIDS  Hypokalemia, may be due to diuretics.   Current K  3.1 Received  40 meq IVx1 with IVF  Oral replenishment Check Mg Repeat BMET in am   Deconditioning, due to above, with falls at home/ Malnutrition. Albumin 1.9  Consult PT and OT Nutrition consult Pain control.  Gout, no acute flare Continue Allopurinol    Type II Diabetes Current blood sugar level is 115 Lab Results  Component Value Date   HGBA1C 7.4 (H) 07/08/2018   Hold home oral diabetic medications.  SSI  COPD without exacerbation Osats normal. Not on O2 at home  Continue nebs and O2 prn     DVT prophylaxis:  SCD  Code Status:    DNR  Family Communication:  Discussed with patient Disposition Plan: Expect patient to be discharged to IP rehab after condition improves Consults called:    None  Admission status: Tele Obs    Marlowe Kays, PA-C [facility]   Amion text  [phone]   07/14/2018, 7:29 AM

## 2018-07-15 DIAGNOSIS — R339 Retention of urine, unspecified: Secondary | ICD-10-CM | POA: Diagnosis present

## 2018-07-15 DIAGNOSIS — N3091 Cystitis, unspecified with hematuria: Secondary | ICD-10-CM | POA: Diagnosis present

## 2018-07-15 DIAGNOSIS — E43 Unspecified severe protein-calorie malnutrition: Secondary | ICD-10-CM | POA: Diagnosis present

## 2018-07-15 DIAGNOSIS — I25119 Atherosclerotic heart disease of native coronary artery with unspecified angina pectoris: Secondary | ICD-10-CM | POA: Diagnosis not present

## 2018-07-15 DIAGNOSIS — N183 Chronic kidney disease, stage 3 (moderate): Secondary | ICD-10-CM | POA: Diagnosis present

## 2018-07-15 DIAGNOSIS — I482 Chronic atrial fibrillation: Secondary | ICD-10-CM | POA: Diagnosis present

## 2018-07-15 DIAGNOSIS — E785 Hyperlipidemia, unspecified: Secondary | ICD-10-CM | POA: Diagnosis present

## 2018-07-15 DIAGNOSIS — R296 Repeated falls: Secondary | ICD-10-CM | POA: Diagnosis present

## 2018-07-15 DIAGNOSIS — Z888 Allergy status to other drugs, medicaments and biological substances status: Secondary | ICD-10-CM | POA: Diagnosis not present

## 2018-07-15 DIAGNOSIS — Z87891 Personal history of nicotine dependence: Secondary | ICD-10-CM | POA: Diagnosis not present

## 2018-07-15 DIAGNOSIS — I340 Nonrheumatic mitral (valve) insufficiency: Secondary | ICD-10-CM | POA: Diagnosis present

## 2018-07-15 DIAGNOSIS — L03114 Cellulitis of left upper limb: Secondary | ICD-10-CM | POA: Diagnosis present

## 2018-07-15 DIAGNOSIS — Z9861 Coronary angioplasty status: Secondary | ICD-10-CM | POA: Diagnosis not present

## 2018-07-15 DIAGNOSIS — J410 Simple chronic bronchitis: Secondary | ICD-10-CM | POA: Diagnosis not present

## 2018-07-15 DIAGNOSIS — J449 Chronic obstructive pulmonary disease, unspecified: Secondary | ICD-10-CM | POA: Diagnosis present

## 2018-07-15 DIAGNOSIS — M7022 Olecranon bursitis, left elbow: Secondary | ICD-10-CM | POA: Diagnosis present

## 2018-07-15 DIAGNOSIS — I129 Hypertensive chronic kidney disease with stage 1 through stage 4 chronic kidney disease, or unspecified chronic kidney disease: Secondary | ICD-10-CM | POA: Diagnosis present

## 2018-07-15 DIAGNOSIS — R338 Other retention of urine: Secondary | ICD-10-CM | POA: Diagnosis not present

## 2018-07-15 DIAGNOSIS — E876 Hypokalemia: Secondary | ICD-10-CM | POA: Diagnosis present

## 2018-07-15 DIAGNOSIS — Z66 Do not resuscitate: Secondary | ICD-10-CM | POA: Diagnosis present

## 2018-07-15 DIAGNOSIS — M11262 Other chondrocalcinosis, left knee: Secondary | ICD-10-CM | POA: Diagnosis present

## 2018-07-15 DIAGNOSIS — Z951 Presence of aortocoronary bypass graft: Secondary | ICD-10-CM | POA: Diagnosis not present

## 2018-07-15 DIAGNOSIS — I2510 Atherosclerotic heart disease of native coronary artery without angina pectoris: Secondary | ICD-10-CM | POA: Diagnosis present

## 2018-07-15 DIAGNOSIS — D62 Acute posthemorrhagic anemia: Secondary | ICD-10-CM | POA: Diagnosis present

## 2018-07-15 DIAGNOSIS — E871 Hypo-osmolality and hyponatremia: Secondary | ICD-10-CM | POA: Diagnosis present

## 2018-07-15 DIAGNOSIS — Z6822 Body mass index (BMI) 22.0-22.9, adult: Secondary | ICD-10-CM | POA: Diagnosis not present

## 2018-07-15 DIAGNOSIS — E1122 Type 2 diabetes mellitus with diabetic chronic kidney disease: Secondary | ICD-10-CM | POA: Diagnosis present

## 2018-07-15 DIAGNOSIS — K2270 Barrett's esophagus without dysplasia: Secondary | ICD-10-CM | POA: Diagnosis present

## 2018-07-15 LAB — CBC
HCT: 27.1 % — ABNORMAL LOW (ref 39.0–52.0)
Hemoglobin: 8.9 g/dL — ABNORMAL LOW (ref 13.0–17.0)
MCH: 29.4 pg (ref 26.0–34.0)
MCHC: 32.8 g/dL (ref 30.0–36.0)
MCV: 89.4 fL (ref 78.0–100.0)
PLATELETS: 613 10*3/uL — AB (ref 150–400)
RBC: 3.03 MIL/uL — AB (ref 4.22–5.81)
RDW: 15.2 % (ref 11.5–15.5)
WBC: 11.9 10*3/uL — AB (ref 4.0–10.5)

## 2018-07-15 LAB — BASIC METABOLIC PANEL
Anion gap: 5 (ref 5–15)
BUN: 15 mg/dL (ref 8–23)
CO2: 23 mmol/L (ref 22–32)
CREATININE: 0.97 mg/dL (ref 0.61–1.24)
Calcium: 7.3 mg/dL — ABNORMAL LOW (ref 8.9–10.3)
Chloride: 103 mmol/L (ref 98–111)
GFR calc Af Amer: >60 mL/min (ref >60)
GLUCOSE: 123 mg/dL — AB (ref 70–99)
Potassium: 3.7 mmol/L (ref 3.5–5.1)
SODIUM: 131 mmol/L — AB (ref 135–145)

## 2018-07-15 LAB — PROTIME-INR
INR: 1.68
PROTHROMBIN TIME: 19.6 s — AB (ref 11.4–15.2)

## 2018-07-15 LAB — URINE CULTURE: CULTURE: NO GROWTH

## 2018-07-15 LAB — GLUCOSE, CAPILLARY
GLUCOSE-CAPILLARY: 103 mg/dL — AB (ref 70–99)
Glucose-Capillary: 131 mg/dL — ABNORMAL HIGH (ref 70–99)
Glucose-Capillary: 147 mg/dL — ABNORMAL HIGH (ref 70–99)
Glucose-Capillary: 157 mg/dL — ABNORMAL HIGH (ref 70–99)

## 2018-07-15 LAB — APTT: APTT: 41 s — AB (ref 24–36)

## 2018-07-15 MED ORDER — COLCHICINE 0.6 MG PO TABS
0.6000 mg | ORAL_TABLET | Freq: Once | ORAL | Status: AC
  Administered 2018-07-15: 0.6 mg via ORAL
  Filled 2018-07-15: qty 1

## 2018-07-15 MED ORDER — APIXABAN 5 MG PO TABS
5.0000 mg | ORAL_TABLET | Freq: Two times a day (BID) | ORAL | Status: DC
  Administered 2018-07-16 – 2018-07-18 (×5): 5 mg via ORAL
  Filled 2018-07-15 (×5): qty 1

## 2018-07-15 MED ORDER — COLCHICINE 0.6 MG PO TABS
1.2000 mg | ORAL_TABLET | Freq: Once | ORAL | Status: AC
  Administered 2018-07-15: 1.2 mg via ORAL
  Filled 2018-07-15: qty 2

## 2018-07-15 MED ORDER — SODIUM CHLORIDE 0.9 % IV SOLN
INTRAVENOUS | Status: DC
  Administered 2018-07-15 – 2018-07-17 (×3): via INTRAVENOUS

## 2018-07-15 MED ORDER — POTASSIUM CHLORIDE CRYS ER 10 MEQ PO TBCR
10.0000 meq | EXTENDED_RELEASE_TABLET | Freq: Every day | ORAL | Status: DC
  Administered 2018-07-15 – 2018-07-18 (×4): 10 meq via ORAL
  Filled 2018-07-15 (×4): qty 1

## 2018-07-15 NOTE — Progress Notes (Deleted)
OT Cancellation Note  Patient Details Name: Eric MorseRobert R Calderon MRN: [id number] DOB: [birth year]   Cancelled Treatment:    Reason Eval/Treat Not Completed: Active bedrest order  [facility]  Nuri Branca, OT/L  OT Clinical Specialist [phone]  07/15/2018, 10:03 AM

## 2018-07-15 NOTE — Progress Notes (Signed)
Eric Calderon  JXB:[id number] DOB: 10-20-1941 DOA: 07/14/2018 PCP: Marva Panda, NP    Brief Narrative:  77 year old male with a history of CAD s/p CABG; HLD; HTN; COPD; DM; and afib who was admitted 7/16-7/19 for acute blood loss anemia and upper GI bleeding in the setting of supratherapeutic INR, status post 3 units of packed red blood cells, 2FFP, status post EGD on 7/17 that showed minimal clots in the stomach, resumed on Eliquis post discharge who presented back to the ED from his SNF w/ urinary retention, a UTI, and hypokalemia. Hematuria was noted on admission, but cleared up after placement of a Foley and IV fluids Patient also noted to have melena in the ED.   Subjective: The patient complains of a gout flare in his left knee and left elbow.  He states his symptoms are perfectly consistent with his usual gout flares but states that he only rarely suffers these flares.  He denies chest pain shortness breath fevers or chills.  He feels that his hematuria has essentially resolved.  Assessment & Plan:  Acute urinary retention - hemorrhagic cystitis - UTI Foley to remain in place for now - continue to treat UTI - gross hematuria has resolved for now - consider voiding trial starting 7/24  Recent UGIB - current melena Has consistently refused colonscopy - EGD 7/17 noted minimal clots in stomach w/ Barrett's esophagus - to continue PPI - no evidence of worsening bleeding at this time  Recent acute blood loss anemia Hgb at d/c stable at ~8 - hemoglobin is stable without evidence of significant acute recurrent bleeding - follow in serial fashion   Recent Labs  Lab 07/14/18 0239 07/14/18 1049 07/14/18 1645 07/14/18 2037 07/15/18 0342  HGB 9.8* 10.2* 9.0* 9.2* 8.9*    Chronic atrial fibrillation Chadsvasc is 4 - cont anticoag as acute bleeding appears to have been small scale and resolved at this time   Hyponatremia Resolving with volume  expansion  Hypokalemia Corrected  HTN Well-controlled at this time  HLD Continue usual outpatient management  CKD stage III Renal function is stable at this time and actually appears to be better than his reported baseline Recent Labs  Lab 07/10/18 0522 07/11/18 0445 07/12/18 0542 07/14/18 0239 07/15/18 0342  CREATININE 1.29* 1.20 1.14 1.15 0.97    Acute Gout flare Reports good results with Indocin at home - I am hesitant to use Indocin in the setting of recent upper GI bleeding - dose with colchicine and follow   DM2 CBG currently well-controlled  COPD Well compensated at this time  DVT prophylaxis: SCDs Code Status: DNR / NO CODE Family Communication: no family present at time of exam  Disposition Plan: yet to be determined  Consultants:  none  Antimicrobials:  Ceftriaxone 7/21 >  Objective: Blood pressure 116/63, pulse 83, temperature 97.6 F (36.4 C), temperature source Oral, resp. rate 16, height 5\' 8"  (1.727 m), weight 71.8 kg (158 lb 4.6 oz), SpO2 97 %.  Intake/Output Summary (Last 24 hours) at 07/15/2018 1515 Last data filed at 07/15/2018 1502 Gross per 24 hour  Intake 1828.86 ml  Output 2125 ml  Net -296.14 ml   Filed Weights   07/14/18 0142 07/14/18 0952 07/15/18 0529  Weight: 65.8 kg (145 lb) 65.8 kg (145 lb 1 oz) 71.8 kg (158 lb 4.6 oz)    Examination: General: No acute respiratory distress Lungs: Clear to auscultation bilaterally without wheezes or crackles Cardiovascular: Regular rate without murmur gallop or rub normal S1 and  S2 Abdomen: Nontender, nondistended, soft, bowel sounds positive, no rebound, no ascites, no appreciable mass Extremities: No significant cyanosis, clubbing, or edema bilateral lower extremities - erythematous fall and left knee and left elbow  CBC: Recent Labs  Lab 07/14/18 0239  07/14/18 1645 07/14/18 2037 07/15/18 0342  WBC 15.5*   < > 13.6* 13.1* 11.9*  NEUTROABS 13.3*  --   --   --   --   HGB 9.8*   < >  9.0* 9.2* 8.9*  HCT 29.3*   < > 27.2* 27.5* 27.1*  MCV 88.8   < > 89.5 89.6 89.4  PLT 628*   < > 609* 608* 613*   < > = values in this interval not displayed.   Basic Metabolic Panel: Recent Labs  Lab 07/10/18 0522 07/11/18 0445 07/12/18 0542 07/14/18 0239 07/15/18 0342  NA 139 135 130* 128* 131*  K 3.3* 3.5 3.7 3.1* 3.7  CL 111 105 103 95* 103  CO2 21* 23 23 24 23   GLUCOSE 84 91 112* 177* 123*  BUN 41* 26* 19 16 15   CREATININE 1.29* 1.20 1.14 1.15 0.97  CALCIUM 7.7* 7.8* 7.5* 7.6* 7.3*  MG 1.7 1.9  --   --   --    GFR: Estimated Creatinine Clearance: 62.7 mL/min (by C-G formula based on SCr of 0.97 mg/dL).  Liver Function Tests: Recent Labs  Lab 07/14/18 0239  AST 70*  ALT 34  ALKPHOS 90  BILITOT 3.0*  PROT 6.0*  ALBUMIN 1.9*    Coagulation Profile: Recent Labs  Lab 07/08/18 2121 07/09/18 0920 07/14/18 0239 07/15/18 0342  INR 1.52 1.37 2.45 1.68    Cardiac Enzymes: Recent Labs  Lab 07/08/18 2121 07/09/18 0346 07/09/18 0920  TROPONINI 0.58* 0.43* 0.25*    HbA1C: Hgb A1c MFr Bld  Date/Time Value Ref Range Status  07/08/2018 07:29 AM 7.4 (H) 4.8 - 5.6 % Final    Comment:    (NOTE) Pre diabetes:          5.7%-6.4% Diabetes:              >6.4% Glycemic control for   <7.0% adults with diabetes     CBG: Recent Labs  Lab 07/14/18 0944 07/14/18 1728 07/14/18 2212 07/15/18 0852 07/15/18 1215  GLUCAP 127* 156* 133* 103* 131*    Recent Results (from the past 240 hour(s))  MRSA PCR Screening     Status: None   Collection Time: 07/08/18  6:10 PM  Result Value Ref Range Status   MRSA by PCR NEGATIVE NEGATIVE Final    Comment:        The GeneXpert MRSA Assay (FDA approved for NASAL specimens only), is one component of a comprehensive MRSA colonization surveillance program. It is not intended to diagnose MRSA infection nor to guide or monitor treatment for MRSA infections. Performed at [facility], [address]  [place], [place] [address]   Culture, blood (Routine x 2)     Status: None (Preliminary result)   Collection Time: 07/14/18  2:39 AM  Result Value Ref Range Status   Specimen Description BLOOD RIGHT ARM  Final   Special Requests   Final    BOTTLES DRAWN AEROBIC ONLY Blood Culture results may not be optimal due to an inadequate volume of blood received in culture bottles   Culture   Final    NO GROWTH 1 DAY Performed at [facility], [address], [place], [place] [address]  Report Status PENDING  Incomplete  Urine culture     Status: None   Collection Time: 07/14/18  2:39 AM  Result Value Ref Range Status   Specimen Description URINE, RANDOM  Final   Special Requests NONE  Final   Culture   Final    NO GROWTH Performed at [facility], [address] [place], [place] [address]    Report Status 07/15/2018 FINAL  Final  Culture, blood (Routine x 2)     Status: None (Preliminary result)   Collection Time: 07/14/18  2:41 AM  Result Value Ref Range Status   Specimen Description BLOOD RIGHT UPPER ARM  Final   Special Requests   Final    BOTTLES DRAWN AEROBIC AND ANAEROBIC Blood Culture adequate volume   Culture   Final    NO GROWTH 1 DAY Performed at [facility], [address] [place], [place] [address]    Report Status PENDING  Incomplete     Scheduled Meds: . fenofibrate  160 mg Oral Daily  . insulin aspart  0-9 Units Subcutaneous TID WC  . metoprolol tartrate  25 mg Oral BID  . pantoprazole  40 mg Oral BID  . simvastatin  40 mg Oral q1800   Continuous Infusions: . 0.9 % NaCl with KCl 40 mEq / L 75 mL/hr (07/15/18 1502)  . cefTRIAXone (ROCEPHIN) IVPB 1 gram/100 mL NS (Mini-Bag Plus) 1 g (07/15/18 0528)     LOS: 0 days   Lonia BloodJeffrey T. Ercole Georg, MD [facility] Office  [phone] Pager - Text Page per Amion  If 7PM-7AM, please contact night-coverage per Amion 07/15/2018, 3:15 PM

## 2018-07-15 NOTE — Plan of Care (Signed)
Pt's pain to improve and pt to remain free of falls.

## 2018-07-15 NOTE — Progress Notes (Signed)
OT Note - Addendum    07/15/18 1500  OT Visit Information  Last OT Received On 07/15/18  Lower Extremity Assessment  Lower Extremity Assessment RLE deficits/detail;LLE deficits/detail  OT Time Calculation  OT Start Time (ACUTE ONLY) 1246  OT Stop Time (ACUTE ONLY) 1317  OT Time Calculation (min) 31 min  OT General Charges  $OT Visit 1 Visit  OT Evaluation  $OT Eval Moderate Complexity 1 Mod  OT Treatments  $Self Care/Home Management  8-22 mins  Luisa DagoHilary Kaylyne Axton, OT/L  OT Clinical Specialist [phone]

## 2018-07-15 NOTE — Progress Notes (Signed)
Occupational Therapy Evaluation Patient Details Name: Eric MorseRobert R Calderon MRN: [id number] DOB: [birth year] Today's Date: 07/15/2018    History of Present Illness 77 year old male with a past medical history of CAD s/p CABG; HLD; HTN; HLD; COPD; DM; and afib recently admitted 7/16-7/19,for acute blood loss anemia and upper GI bleeding in the setting of supratherapeutic INR, status post 3 units of packed red blood cells, 2FFP, status post EGD on 7/17 that showed minimal clots in the stomach, resumed on Eliquis post discharge who presents to the ED from SNF for urinary retention,UTI,hypokalemia with a potassium of 3.1.   Clinical Impression   Pt recently hospitalized 716-7/19 and d/c to SNF. Pt currently requires maxA for functional mobility due to limitations from pain. Session limited due to pt becoming orthostatic and symptomatic with reports of lightheadedness with initial sit>stand, BP 88/48 after return to sitting. Second sit>stand, BP 88/48 after 1 min standing. SpO2 95%-86% RA. Pt required maxA for stand-pivot to recliner. Will continue to see acutely. Currently recommending SNF follow-up.      Follow Up Recommendations  Supervision/Assistance - 24 hour;SNF    Equipment Recommendations  Tub/shower seat;3 in 1 bedside commode    Recommendations for Other Services PT consult     Precautions / Restrictions Precautions Precautions: Fall Restrictions Weight Bearing Restrictions: No      Mobility Bed Mobility Overal bed mobility: Needs Assistance Bed Mobility: Rolling;Sidelying to Sit Rolling: Min assist Sidelying to sit: Mod assist       General bed mobility comments: modA for progression of trunk to upright position  Transfers Overall transfer level: Needs assistance Equipment used: Rolling walker (2 wheeled);1 person hand held assist Transfers: Sit to/from [employer] CorporationStand;Stand Pivot Transfers Sit to Stand: Max assist;From elevated surface Stand pivot transfers: Max assist        General transfer comment: maxA for powerup with bed elevated;pt became orthostatic while standing    Balance Overall balance assessment: Needs assistance Sitting-balance support: Feet supported Sitting balance-Leahy Scale: Fair Sitting balance - Comments: minguard    Standing balance support: Bilateral upper extremity supported;During functional activity Standing balance-Leahy Scale: Poor Standing balance comment: Pt was dependent on RW for support of standing control;VC for upright positioning;mod-maxA for stability while standing                           ADL either performed or assessed with clinical judgement   ADL Overall ADL's : Needs assistance/impaired Eating/Feeding: Supervision/ safety   Grooming: Sitting;Min guard   Upper Body Bathing: Sitting;Min guard   Lower Body Bathing: Maximal assistance;Sit to/from stand   Upper Body Dressing : Min guard;Sitting   Lower Body Dressing: Maximal assistance;Sit to/from stand   Toilet Transfer: Maximal assistance;RW;Stand-pivot Toilet Transfer Details (indicate cue type and reason): simulated stand-pivot, maxA to powerup and modA for stability while standing Toileting- Clothing Manipulation and Hygiene: Maximal assistance;Sit to/from stand       Functional mobility during ADLs: Maximal assistance;Rolling walker       Vision Baseline Vision/History: Wears glasses Wears Glasses: Reading only Patient Visual Report: No change from baseline       Perception     Praxis      Pertinent Vitals/Pain Pain Assessment: Faces Faces Pain Scale: Hurts worst Pain Location: L knee Pain Descriptors / Indicators: Guarding;Grimacing;Sore;Tender Pain Intervention(s): Limited activity within patient's tolerance;Monitored during session     Hand Dominance Left   Extremity/Trunk Assessment Upper Extremity Assessment Upper Extremity Assessment: Generalized weakness LUE Deficits /  Details: visible bruise on LUE LUE  Sensation: WNL LUE Coordination: WNL   Lower Extremity Assessment Lower Extremity Assessment: Defer to PT evaluation RLE Deficits / Details: significant pain in L knee due to gout   Cervical / Trunk Assessment Cervical / Trunk Assessment: Normal   Communication Communication Communication: No difficulties   Cognition Arousal/Alertness: Awake/alert Behavior During Therapy: WFL for tasks assessed/performed Overall Cognitive Status: Within Functional Limits for tasks assessed                                     General Comments  pt became orthostatic and symptomatic with initial standing;pt returned to sitting, BP 88/48,BP 88/48 after 1 min standing    Exercises     Shoulder Instructions      Home Living Family/patient expects to be discharged to:: Skilled nursing facility Living Arrangements: Alone(prior to going to [facility] for rehab) Available Help at Discharge: Neighbor;Available PRN/intermittently Type of Home: Mobile home Home Access: Stairs to enter Entrance Stairs-Number of Steps: 4 Entrance Stairs-Rails: Can reach both;Left;Right Home Layout: One level     Bathroom Shower/Tub: Tub only;Walk-in shower   Bathroom Toilet: Standard Bathroom Accessibility: Yes How Accessible: Accessible via walker Home Equipment: Walker - 2 wheels   Additional Comments: pt admitted from [facility]      Prior Functioning/Environment Level of Independence: Independent        Comments: multiple falls despite not using AD        OT Problem List: Decreased strength;Decreased range of motion;Decreased activity tolerance;Impaired balance (sitting and/or standing);Decreased safety awareness;Cardiopulmonary status limiting activity;Pain      OT Treatment/Interventions: Self-care/ADL training;Therapeutic exercise;Energy conservation;DME and/or AE instruction;Therapeutic activities;Cognitive remediation/compensation;Patient/family education;Balance training    OT  Goals(Current goals can be found in the care plan section) Acute Rehab OT Goals Patient Stated Goal: to get stronger in his legs OT Goal Formulation: With patient Time For Goal Achievement: 07/29/18 Potential to Achieve Goals: Good  OT Frequency: Min 2X/week   Barriers to D/C:            Co-evaluation              AM-PAC PT "6 Clicks" Daily Activity     Outcome Measure Help from another person eating meals?: None Help from another person taking care of personal grooming?: A Little Help from another person toileting, which includes using toliet, bedpan, or urinal?: A Lot Help from another person bathing (including washing, rinsing, drying)?: A Lot Help from another person to put on and taking off regular upper body clothing?: A Little Help from another person to put on and taking off regular lower body clothing?: A Lot 6 Click Score: 16   End of Session Equipment Utilized During Treatment: Gait belt;Rolling walker Nurse Communication: Mobility status;Other (comment)(orthostatic)  Activity Tolerance: Treatment limited secondary to medical complications (Comment)(pt orthostatic with sit>stand) Patient left: in chair;with call bell/phone within reach;with chair alarm set  OT Visit Diagnosis: Other abnormalities of gait and mobility (R26.89);Unsteadiness on feet (R26.81);Muscle weakness (generalized) (M62.81);Pain;History of falling (Z91.81) Pain - Right/Left: Left Pain - part of body: Knee                Time: 1610-9604 OT Time Calculation (min): 32 min Charges:    G-Codes:     Diona Browner OTS    Diona Browner 07/15/2018, 2:40 PM

## 2018-07-15 NOTE — Evaluation (Signed)
Physical Therapy Evaluation Patient Details Name: Eric Calderon MRN: [id number] DOB: March 05, 1941 Today's Date: 07/15/2018   History of Present Illness  ABDOULAYE DRUM is a 77 y.o. male with medical history significant make A. Fib on Coumadin, CAD status post CABG 4 2008, hyperlipidemia, hypertension, diabetes, COPD not on home oxygen presents to the emergency Department chief complaint of persistent worsening generalized weakness with multiple falls. Initial evaluation reveals likely GI bleed secondary to supratherapeutic INR.   Clinical Impression  Orders received for PT evaluation. Patient demonstrates deficits in functional mobility as indicated below. Will benefit from continued skilled PT to address deficits and maximize function. Will see as indicated and progress as tolerated.      Follow Up Recommendations SNF    Equipment Recommendations  Rolling walker with 5" wheels    Recommendations for Other Services       Precautions / Restrictions Precautions Precautions: Fall Restrictions Weight Bearing Restrictions: No      Mobility  Bed Mobility Overal bed mobility: Needs Assistance Bed Mobility: Rolling;Sit to Supine Rolling: Min assist Sidelying to sit: Mod assist   Sit to supine: Min assist   General bed mobility comments: Min assist for LE movement back to bed and assist for repositioning  Transfers Overall transfer level: Needs assistance Equipment used: None;1 person hand held assist(face to face with gait belt and wrap around support) Transfers: Squat Pivot Transfers     Squat pivot transfers: Max assist     General transfer comment: max assist for squat pivot from recliner back to bed. Assist to reposition at edge of chair and intiated anterior trnaslation during pivotal movement. Wrap around support with gait belt provided  Ambulation/Gait             General Gait Details: unable to get pt to take a step  Stairs            Wheelchair  Mobility    Modified Rankin (Stroke Patients Only)       Balance Overall balance assessment: Needs assistance Sitting-balance support: Feet supported Sitting balance-Leahy Scale: Fair Sitting balance - Comments: minguard    Standing balance support: Bilateral upper extremity supported;During functional activity Standing balance-Leahy Scale: Poor Standing balance comment: unable to come to standing                             Pertinent Vitals/Pain Pain Assessment: Faces Faces Pain Scale: Hurts worst Pain Location: L knee Pain Descriptors / Indicators: Guarding;Grimacing;Sore;Tender Pain Intervention(s): Limited activity within patient's tolerance;Monitored during session    Home Living Family/patient expects to be discharged to:: Skilled nursing facility Living Arrangements: Alone(prior to going to [facility] for rehab) Available Help at Discharge: Neighbor;Available PRN/intermittently Type of Home: Mobile home Home Access: Stairs to enter Entrance Stairs-Rails: Can reach both;Left;Right Entrance Stairs-Number of Steps: 4 Home Layout: One level Home Equipment: Walker - 2 wheels Additional Comments: pt admitted from [facility]    Prior Function Level of Independence: Independent         Comments: multiple falls despite not using AD     Hand Dominance   Dominant Hand: Left    Extremity/Trunk Assessment   Upper Extremity Assessment Upper Extremity Assessment: Generalized weakness LUE Deficits / Details: visible bruise on LUE LUE Sensation: WNL LUE Coordination: WNL    Lower Extremity Assessment Lower Extremity Assessment: RLE deficits/detail;LLE deficits/detail(Simultaneous filing. User may not have seen previous data.) LLE Deficits / Details: noted difficulty with knee flexion  and LE strength, buckling of LLE during transitional movements with noted lateral instability. LLE: Unable to fully assess due to pain    Cervical / Trunk  Assessment Cervical / Trunk Assessment: Kyphotic  Communication   Communication: No difficulties  Cognition Arousal/Alertness: Awake/alert Behavior During Therapy: WFL for tasks assessed/performed Overall Cognitive Status: Within Functional Limits for tasks assessed                                        General Comments General comments (skin integrity, edema, etc.): patient extremely lightheaded with some tinnitus, assisted back to bed. BP labile and nursing in room    Exercises     Assessment/Plan    PT Assessment Patient needs continued PT services  PT Problem List Decreased strength;Decreased range of motion;Decreased activity tolerance;Decreased balance;Decreased mobility;Decreased coordination;Decreased cognition;Decreased knowledge of use of DME;Decreased safety awareness;Cardiopulmonary status limiting activity       PT Treatment Interventions DME instruction;Gait training;Functional mobility training;Therapeutic activities;Therapeutic exercise;Balance training;Neuromuscular re-education;Cognitive remediation;Patient/family education;Stair training    PT Goals (Current goals can be found in the Care Plan section)  Acute Rehab PT Goals Patient Stated Goal: to get stronger in his legs PT Goal Formulation: With patient Time For Goal Achievement: 07/25/18 Potential to Achieve Goals: Good    Frequency Min 2X/week   Barriers to discharge Inaccessible home environment;Decreased caregiver support      Co-evaluation               AM-PAC PT "6 Clicks" Daily Activity  Outcome Measure Difficulty turning over in bed (including adjusting bedclothes, sheets and blankets)?: Unable Difficulty moving from lying on back to sitting on the side of the bed? : Unable Difficulty sitting down on and standing up from a chair with arms (e.g., wheelchair, bedside commode, etc,.)?: Unable Help needed moving to and from a bed to chair (including a wheelchair)?: A  Lot Help needed walking in hospital room?: Total Help needed climbing 3-5 steps with a railing? : Total 6 Click Score: 7    End of Session Equipment Utilized During Treatment: Gait belt Activity Tolerance: Patient limited by fatigue;Treatment limited secondary to medical complications (Comment)(labile BP) Patient left: in bed;with call bell/phone within reach;with bed alarm set;with nursing/sitter in room Nurse Communication: Mobility status PT Visit Diagnosis: Unsteadiness on feet (R26.81);Muscle weakness (generalized) (M62.81);Difficulty in walking, not elsewhere classified (R26.2)    Time: [id number] PT Time Calculation (min) (ACUTE ONLY): 21 min   Charges:   PT Evaluation $PT Eval Moderate Complexity: 1 Mod     PT G Codes:        Charlotte Crumbevon Mable Dara, PT DPT  Board Certified Neurologic Specialist [phone]   Fabio AsaDevon J Lavonne Cass 07/15/2018, 3:17 PM

## 2018-07-15 NOTE — Progress Notes (Addendum)
Initial Nutrition Assessment  DOCUMENTATION CODES:   Severe malnutrition in context of acute illness/injury  INTERVENTION:    Glucerna Shake po TID, each supplement provides 220 kcal and 10 grams of protein  NUTRITION DIAGNOSIS:   Severe Malnutrition related to acute illness(hemorrhagic cystitis, recent UGIB) as evidenced by energy intake < or equal to 50% for > or equal to 5 days, moderate fat depletion, moderate muscle depletion and 6% weight loss x 1 month  GOAL:   Patient will meet greater than or equal to 90% of their needs  MONITOR:   PO intake, Supplement acceptance, Labs, Weight trends, Skin, I & O's  REASON FOR ASSESSMENT:   Malnutrition Screening Tool, Consult Assessment of nutrition requirement/status  ASSESSMENT:   [age] y.o. male with PMH of A. Fib on Coumadin, CAD status post CABG 4 2008, hyperlipidemia, hypertension, diabetes, COPD not on home oxygen presented to the ED with chief complaint of persistent worsening generalized weakness with multiple falls.    Pt admitted for Hemorrhagic cystitis [N30.91] Urinary retention [R33.9] Rectal bleeding [K62.5] Chronic atrial fibrillation (HCC) [I48.2]   RD spoke with patient at bedside. RN, Joni ReiningNicole present. Pt reports a decreased appetite today. He states his stomach has been upset. He reveals he was not eating (not even broth or jello) for approximately 10 days PTA.  Pt also endorses a 10-11 lbs unintentional weight loss (6%) in the last month. He is amenable to trying Glucerna Shake supplements during his stay here. Labs and medications reviewed. Na 131 (L). CBG's [id number].  Pt with hx of poor PO intake, weight loss, muscle and fat depletions. He was identified with moderate malnutrition (acute illness) during previous admission. Feel now he meets criteria for severe malnutrition due to ongoing above clinical characteristics.  NUTRITION - FOCUSED PHYSICAL EXAM:    Most Recent Value  Orbital Region   Moderate depletion  Upper Arm Region  Mild depletion  Thoracic and Lumbar Region  Moderate depletion  Buccal Region  Moderate depletion  Temple Region  Mild depletion  Clavicle Bone Region  Mild depletion  Clavicle and Acromion Bone Region  Mild depletion  Scapular Bone Region  Mild depletion  Dorsal Hand  Unable to assess  Patellar Region  Moderate depletion  Anterior Thigh Region  Moderate depletion  Posterior Calf Region  Moderate depletion     Diet Order:   Diet Order           Diet Carb Modified Fluid consistency: Thin; Room service appropriate? Yes; Fluid restriction: 1500 mL Fluid  Diet effective now         EDUCATION NEEDS:   No education needs have been identified at this time  Skin:  Skin Assessment: Reviewed RN Assessment  Last BM:  7/22  Height:   Ht Readings from Last 1 Encounters:  07/14/18 5\' 8"  (1.727 m)   Weight:   Wt Readings from Last 1 Encounters:  07/15/18 158 lb 4.6 oz (71.8 kg)   BMI:  Body mass index is 24.07 kg/m.  Estimated Nutritional Needs:   Kcal:  1800-2000  Protein:  90-105 gm  Fluid:  1.8-2.0 L  Maureen ChattersKatie Ryszard Socarras, RD, LDN Pager #: [phone] After-Hours Pager #: [phone]

## 2018-07-16 ENCOUNTER — Inpatient Hospital Stay (HOSPITAL_COMMUNITY): Payer: Medicare Other

## 2018-07-16 DIAGNOSIS — I340 Nonrheumatic mitral (valve) insufficiency: Secondary | ICD-10-CM

## 2018-07-16 DIAGNOSIS — R338 Other retention of urine: Secondary | ICD-10-CM

## 2018-07-16 DIAGNOSIS — R339 Retention of urine, unspecified: Secondary | ICD-10-CM

## 2018-07-16 DIAGNOSIS — I10 Essential (primary) hypertension: Secondary | ICD-10-CM

## 2018-07-16 DIAGNOSIS — D649 Anemia, unspecified: Secondary | ICD-10-CM

## 2018-07-16 DIAGNOSIS — I25119 Atherosclerotic heart disease of native coronary artery with unspecified angina pectoris: Secondary | ICD-10-CM

## 2018-07-16 DIAGNOSIS — E785 Hyperlipidemia, unspecified: Secondary | ICD-10-CM

## 2018-07-16 DIAGNOSIS — E119 Type 2 diabetes mellitus without complications: Secondary | ICD-10-CM

## 2018-07-16 DIAGNOSIS — I482 Chronic atrial fibrillation: Secondary | ICD-10-CM

## 2018-07-16 DIAGNOSIS — R310 Gross hematuria: Secondary | ICD-10-CM

## 2018-07-16 DIAGNOSIS — J439 Emphysema, unspecified: Secondary | ICD-10-CM

## 2018-07-16 DIAGNOSIS — E43 Unspecified severe protein-calorie malnutrition: Secondary | ICD-10-CM

## 2018-07-16 DIAGNOSIS — J410 Simple chronic bronchitis: Secondary | ICD-10-CM

## 2018-07-16 LAB — COMPREHENSIVE METABOLIC PANEL
ALT: 21 U/L (ref 0–44)
AST: 36 U/L (ref 15–41)
Albumin: 1.6 g/dL — ABNORMAL LOW (ref 3.5–5.0)
Alkaline Phosphatase: 86 U/L (ref 38–126)
Anion gap: 5 (ref 5–15)
BUN: 14 mg/dL (ref 8–23)
CO2: 23 mmol/L (ref 22–32)
CREATININE: 0.96 mg/dL (ref 0.61–1.24)
Calcium: 7.4 mg/dL — ABNORMAL LOW (ref 8.9–10.3)
Chloride: 105 mmol/L (ref 98–111)
GFR calc Af Amer: >60 mL/min (ref >60)
Glucose, Bld: 143 mg/dL — ABNORMAL HIGH (ref 70–99)
Potassium: 4.1 mmol/L (ref 3.5–5.1)
Sodium: 133 mmol/L — ABNORMAL LOW (ref 135–145)
Total Bilirubin: 1.8 mg/dL — ABNORMAL HIGH (ref 0.3–1.2)
Total Protein: 5.2 g/dL — ABNORMAL LOW (ref 6.5–8.1)

## 2018-07-16 LAB — CBC
HCT: 28.5 % — ABNORMAL LOW (ref 39.0–52.0)
Hemoglobin: 9 g/dL — ABNORMAL LOW (ref 13.0–17.0)
MCH: 29.3 pg (ref 26.0–34.0)
MCHC: 31.6 g/dL (ref 30.0–36.0)
MCV: 92.8 fL (ref 78.0–100.0)
Platelets: 675 10*3/uL — ABNORMAL HIGH (ref 150–400)
RBC: 3.07 MIL/uL — ABNORMAL LOW (ref 4.22–5.81)
RDW: 15.9 % — ABNORMAL HIGH (ref 11.5–15.5)
WBC: 10 10*3/uL (ref 4.0–10.5)

## 2018-07-16 LAB — GLUCOSE, CAPILLARY
GLUCOSE-CAPILLARY: 119 mg/dL — AB (ref 70–99)
Glucose-Capillary: 117 mg/dL — ABNORMAL HIGH (ref 70–99)
Glucose-Capillary: 107 mg/dL — ABNORMAL HIGH (ref 70–99)
Glucose-Capillary: 129 mg/dL — ABNORMAL HIGH (ref 70–99)

## 2018-07-16 LAB — URIC ACID: Uric Acid, Serum: 2.3 mg/dL — ABNORMAL LOW (ref 3.7–8.6)

## 2018-07-16 NOTE — Progress Notes (Signed)
PROGRESS NOTE  Eric Calderon  ZOX:[id number] DOB: 1941/01/31 DOA: 07/14/2018 PCP: Marva Panda, NP   Brief Narrative: Eric Calderon is a 77 y.o. male with a history of CAD s/p CABG, HTN, HLD, T2DM, AFib, COPD, and recent admission for blood loss anemia due to upper GI bleeding. He was discharged to SNF after admission 7/16-7/19 for acute blood loss anemia and upper GI bleeding in the setting of supratherapeutic INR, status post 3 units of packed red blood cells, 2FFP, status post EGD on 7/17 that showed minimal clots in the stomach, started on eliquis post discharge who presented back to the ED from his SNF w/ urinary retention, a UTI, and hypokalemia. Hematuria was noted on admission, but cleared up after placement of a Foley and IV fluids.  Assessment & Plan: Principal Problem:   Acute urinary retention Active Problems:   Hyperlipidemia with target LDL less than 70   Essential hypertension   Chronic atrial fibrillation (HCC); CHA2DS2-VASc Score - 4   3-vessel CAD - 70% ISR and circumflex, diffuse 87 the RCA (nondominant), severe bifurcation LAD diagonal lesion 80 and 60% --> sent for CABG x4   Emphysema lung (HCC)   Moderate mitral regurgitation   Diabetes mellitus without complication (HCC)   Symptomatic anemia   COPD (chronic obstructive pulmonary disease) (HCC)   GI bleeding   Leukocytosis   Malnutrition of moderate degree   Gross hematuria   Urine retention   Urinary retention   Protein-calorie malnutrition, severe  Frequent falls:  - PT/OT, will require assistive devices and SNF therapy.   Left knee swelling and pain. Suspect ligamentous injury, though pt is poor historian and can't recall any specific trauma or time of onset. Serum uric acid is LOW, in conjunction with exam, doubt this is due to gout. - Monitor closely, check XR, and consider MRI if not proceeding with slow improvement as expected. - Topical ice packs improving symptoms.   Left olecranon bursitis  complicated by cellulitis: Suspect initial insult was trauma from fall.  - Ice, elevation, continue ceftriaxone for now.   Acute urinary retention: Possibly due to pain and/or hemorrhagic cystitis/UTI:  - Continue ceftriaxone.  - Pt reluctant to have voiding trial. Will likely proceed with this 7/25 after UTI Tx continued.   Recent acute blood loss anemia due to upper GI bleed: With minimal clots in stomach on EGD 7/17 showing no signs of active bleeding. Barrett's esophagus noted.  - Was not discharged on PPI. This will need to be continued here and at discharge.  - Hgb has been stable since discharge despite reports of hematuria and melena (which the pt currently denies).  - Follow up with [facility], Dr. Ewing Schlein to discuss colonoscopy (pt declined at last hospitalization)  Chronic AFib:  - Continue metoprolol and eliquis.   Stage III CKD:  - Monitor SCr  HTN: Currently soft BP - Continue metoprololas tolerated  COPD: With reported >100 pack-year smoking history.  - Incentive spirometry, ambulate as able - Duonebs prn  T2DM: HbA1c 7.4%.  - Holding oral medications janumet, glipizide - SSI  Hyperlipidemia:  - Continue statin  DVT prophylaxis: Eliquis Code Status: DNR Family Communication: None at bedside Disposition Plan: SNF once work up complete.   Consultants:   None  Procedures:   None  Antimicrobials:  Ceftriaxone    Subjective: Complains of multiple falls with difficulty explaining the surrounding events. Denies LOC but says it happens so fast, he's unable to tell whether he feels dizzy or  his legs just give out or what happens... He has pain in his left knee > right knee with swelling which has improved. Left elbow pain is improving but noted swelling and redness proximally. Denies any melena or hematochezia or hematuria.   Objective: Vitals:   07/15/18 2106 07/16/18 0500 07/16/18 0511 07/16/18 1401  BP: (!) 144/60  114/60 123/70  Pulse: 62  81 90    Resp:   16   Temp:   97.7 F (36.5 C) 98 F (36.7 C)  TempSrc:   Oral Oral  SpO2:   99% 99%  Weight:  71.7 kg (158 lb 1.1 oz)    Height:        Intake/Output Summary (Last 24 hours) at 07/16/2018 1558 Last data filed at 07/16/2018 1534 Gross per 24 hour  Intake 712.3 ml  Output 1900 ml  Net -1187.7 ml   Filed Weights   07/14/18 0952 07/15/18 0529 07/16/18 0500  Weight: 65.8 kg (145 lb 1 oz) 71.8 kg (158 lb 4.6 oz) 71.7 kg (158 lb 1.1 oz)    Gen: 77 y.o. male in no distress Pulm: Non-labored breathing. Distant but no crackles or wheezing.  CV: Irreg irreg. No murmur, rub, or gallop. No JVD, no pedal edema. GI: Abdomen soft, non-tender, non-distended, with normoactive bowel sounds. No organomegaly or masses felt. Ext: Warm. Left knee with bony hypertrophy and focal tenderness over medial > lateral joint lines, though it is tender to palpation (not light palpation) diffusely.  Skin: Left elbow with 1+ pitting edema, boggy bursa, extending distally and proximally there is bright erythema not consistent with bruising (which he has at multiple other sites). It is warm and mildly tender. Neuro: Alert and oriented. No focal neurological deficits. Psych: Judgement and insight appear normal. Mood & affect appropriate.   Data Reviewed: I have personally reviewed following labs and imaging studies  CBC: Recent Labs  Lab 07/14/18 0239 07/14/18 1049 07/14/18 1645 07/14/18 2037 07/15/18 0342 07/16/18 0604  WBC 15.5* 13.8* 13.6* 13.1* 11.9* 10.0  NEUTROABS 13.3*  --   --   --   --   --   HGB 9.8* 10.2* 9.0* 9.2* 8.9* 9.0*  HCT 29.3* 30.9* 27.2* 27.5* 27.1* 28.5*  MCV 88.8 89.0 89.5 89.6 89.4 92.8  PLT 628* 677* 609* 608* 613* 675*   Basic Metabolic Panel: Recent Labs  Lab 07/10/18 0522 07/11/18 0445 07/12/18 0542 07/14/18 0239 07/15/18 0342 07/16/18 0604  NA 139 135 130* 128* 131* 133*  K 3.3* 3.5 3.7 3.1* 3.7 4.1  CL 111 105 103 95* 103 105  CO2 21* 23 23 24 23 23    GLUCOSE 84 91 112* 177* 123* 143*  BUN 41* 26* 19 16 15 14   CREATININE 1.29* 1.20 1.14 1.15 0.97 0.96  CALCIUM 7.7* 7.8* 7.5* 7.6* 7.3* 7.4*  MG 1.7 1.9  --   --   --   --    GFR: Estimated Creatinine Clearance: 63.3 mL/min (by C-G formula based on SCr of 0.96 mg/dL). Liver Function Tests: Recent Labs  Lab 07/14/18 0239 07/16/18 0604  AST 70* 36  ALT 34 21  ALKPHOS 90 86  BILITOT 3.0* 1.8*  PROT 6.0* 5.2*  ALBUMIN 1.9* 1.6*   No results for input(s): LIPASE, AMYLASE in the last 168 hours. No results for input(s): AMMONIA in the last 168 hours. Coagulation Profile: Recent Labs  Lab 07/14/18 0239 07/15/18 0342  INR 2.45 1.68   Cardiac Enzymes: No results for input(s): CKTOTAL, CKMB, CKMBINDEX, TROPONINI  in the last 168 hours. BNP (last 3 results) No results for input(s): PROBNP in the last 8760 hours. HbA1C: No results for input(s): HGBA1C in the last 72 hours. CBG: Recent Labs  Lab 07/15/18 1215 07/15/18 1659 07/15/18 2141 07/16/18 0759 07/16/18 1210  GLUCAP 131* 147* 157* 117* 119*   Lipid Profile: No results for input(s): CHOL, HDL, LDLCALC, TRIG, CHOLHDL, LDLDIRECT in the last 72 hours. Thyroid Function Tests: No results for input(s): TSH, T4TOTAL, FREET4, T3FREE, THYROIDAB in the last 72 hours. Anemia Panel: No results for input(s): VITAMINB12, FOLATE, FERRITIN, TIBC, IRON, RETICCTPCT in the last 72 hours. Urine analysis:    Component Value Date/Time   COLORURINE RED (A) 07/14/2018 0244   APPEARANCEUR TURBID (A) 07/14/2018 0244   LABSPEC 1.015 07/14/2018 0244   PHURINE 6.5 07/14/2018 0244   GLUCOSEU >=500 (A) 07/14/2018 0244   HGBUR LARGE (A) 07/14/2018 0244   BILIRUBINUR MODERATE (A) 07/14/2018 0244   KETONESUR 15 (A) 07/14/2018 0244   PROTEINUR >300 (A) 07/14/2018 0244   NITRITE POSITIVE (A) 07/14/2018 0244   LEUKOCYTESUR MODERATE (A) 07/14/2018 0244   Recent Results (from the past 240 hour(s))  MRSA PCR Screening     Status: None    Collection Time: 07/08/18  6:10 PM  Result Value Ref Range Status   MRSA by PCR NEGATIVE NEGATIVE Final    Comment:        The GeneXpert MRSA Assay (FDA approved for NASAL specimens only), is one component of a comprehensive MRSA colonization surveillance program. It is not intended to diagnose MRSA infection nor to guide or monitor treatment for MRSA infections. Performed at [facility], [address], [place], [place] [address]   Culture, blood (Routine x 2)     Status: None (Preliminary result)   Collection Time: 07/14/18  2:39 AM  Result Value Ref Range Status   Specimen Description BLOOD RIGHT ARM  Final   Special Requests   Final    BOTTLES DRAWN AEROBIC ONLY Blood Culture results may not be optimal due to an inadequate volume of blood received in culture bottles   Culture   Final    NO GROWTH 2 DAYS Performed at [facility], [address], [place], [place] [address]    Report Status PENDING  Incomplete  Urine culture     Status: None   Collection Time: 07/14/18  2:39 AM  Result Value Ref Range Status   Specimen Description URINE, RANDOM  Final   Special Requests NONE  Final   Culture   Final    NO GROWTH Performed at [facility], [address] [place], [place] [address]    Report Status 07/15/2018 FINAL  Final  Culture, blood (Routine x 2)     Status: None (Preliminary result)   Collection Time: 07/14/18  2:41 AM  Result Value Ref Range Status   Specimen Description BLOOD RIGHT UPPER ARM  Final   Special Requests   Final    BOTTLES DRAWN AEROBIC AND ANAEROBIC Blood Culture adequate volume   Culture   Final    NO GROWTH 2 DAYS Performed at [facility], [address] [place], [place] [address]    Report Status PENDING  Incomplete      Radiology Studies: No results found.  Scheduled Meds: . apixaban  5 mg Oral BID  . fenofibrate  160 mg Oral Daily  . insulin aspart  0-9 Units Subcutaneous TID WC  . metoprolol tartrate  25  mg Oral BID  . pantoprazole  40 mg Oral BID  . potassium chloride  10 mEq Oral Daily  . simvastatin  40 mg Oral q1800   Continuous Infusions: . sodium chloride 60 mL/hr at 07/16/18 0921  . cefTRIAXone (ROCEPHIN) IVPB 1 gram/100 mL NS (Mini-Bag Plus) 1 g (07/16/18 0450)     LOS: 1 day   Time spent: 35 minutes.  Tyrone Nine, MD [facility] [email or web address] Password [facility] 07/16/2018, 3:58 PM

## 2018-07-17 DIAGNOSIS — E440 Moderate protein-calorie malnutrition: Secondary | ICD-10-CM

## 2018-07-17 MED ORDER — SODIUM CHLORIDE 0.9 % IV SOLN
1.0000 g | INTRAVENOUS | Status: AC
  Administered 2018-07-18: 1 g via INTRAVENOUS
  Filled 2018-07-17: qty 10

## 2018-07-17 MED ORDER — METHYLPREDNISOLONE ACETATE 40 MG/ML IJ SUSP
40.0000 mg | Freq: Once | INTRAMUSCULAR | Status: DC
  Filled 2018-07-17: qty 1

## 2018-07-17 MED ORDER — BUPIVACAINE HCL (PF) 0.5 % IJ SOLN
10.0000 mL | Freq: Once | INTRAMUSCULAR | Status: DC
  Filled 2018-07-17: qty 10

## 2018-07-17 NOTE — Progress Notes (Signed)
Pt's Foley removed. Discussed voiding trial with patient, pt taught back and verbalized understanding. Will monitor. Pt tolerated removal well.

## 2018-07-17 NOTE — Progress Notes (Signed)
Physical Therapy Treatment Patient Details Name: Eric Calderon MRN: [id number] DOB: June 14, 1941 Today's Date: 07/17/2018    History of Present Illness Eric Calderon is a 77 y.o. male with medical history significant make A. Fib on Coumadin, CAD status post CABG 4 2008, hyperlipidemia, hypertension, diabetes, COPD not on home oxygen presents to the emergency Department chief complaint of persistent worsening generalized weakness with multiple falls. Initial evaluation reveals likely GI bleed secondary to supratherapeutic INR., L knee and elbow pain    PT Comments    Pt knee pain much improved, able to WB on LLE and amb short distance with RW in room  today, limited by fatigue more than pain; encouraged pt to continue to ice L  knee as tolerated; pt will benefit from SNF post acute to maximize independence and safety prior to return home  Follow Up Recommendations  SNF     Equipment Recommendations  Rolling walker with 5" wheels;None recommended by PT(RW if d/c other than SNF)    Recommendations for Other Services       Precautions / Restrictions Precautions Precautions: Fall Restrictions Weight Bearing Restrictions: No    Mobility  Bed Mobility Overal bed mobility: Needs Assistance Bed Mobility: Supine to Sit     Supine to sit: Supervision     General bed mobility comments: incr time to complete, sueprvision for safety  Transfers Overall transfer level: Needs assistance Equipment used: Rolling walker (2 wheeled) Transfers: Sit to/from [employer] Sit to Stand: Min assist         General transfer comment: min assist for anterior wt translation and to transition to RW, cues for overall safety and hand placement  Ambulation/Gait Ambulation/Gait assistance: Min assist;Min guard Gait Distance (Feet): 20 Feet Assistive device: Rolling walker (2 wheeled) Gait Pattern/deviations: Step-to pattern;Decreased weight shift to left;Trunk flexed     General  Gait Details: cues for sequence, min assist to maneuver RW in confining spaces in room; pt able to amb in room, fatigued quickly   Stairs             Wheelchair Mobility    Modified Rankin (Stroke Patients Only)       Balance     Sitting balance-Leahy Scale: Fair     Standing balance support: Bilateral upper extremity supported;During functional activity Standing balance-Leahy Scale: Poor Standing balance comment: reliant on UEs                            Cognition Arousal/Alertness: Awake/alert Behavior During Therapy: WFL for tasks assessed/performed Overall Cognitive Status: Within Functional Limits for tasks assessed                                        Exercises      General Comments        Pertinent Vitals/Pain Pain Assessment: Faces Faces Pain Scale: Hurts a little bit Pain Location: L knee Pain Descriptors / Indicators: Guarding;Grimacing;Sore Pain Intervention(s): Limited activity within patient's tolerance;Monitored during session;Ice applied    Home Living                      Prior Function            PT Goals (current goals can now be found in the care plan section) Acute Rehab PT Goals Patient Stated Goal: to get stronger  in his legs PT Goal Formulation: With patient Time For Goal Achievement: 07/25/18 Potential to Achieve Goals: Good Progress towards PT goals: Progressing toward goals    Frequency    Min 2X/week      PT Plan Current plan remains appropriate    Co-evaluation              AM-PAC PT "6 Clicks" Daily Activity  Outcome Measure  Difficulty turning over in bed (including adjusting bedclothes, sheets and blankets)?: A Little Difficulty moving from lying on back to sitting on the side of the bed? : A Little Difficulty sitting down on and standing up from a chair with arms (e.g., wheelchair, bedside commode, etc,.)?: Unable Help needed moving to and from a bed to chair  (including a wheelchair)?: A Little Help needed walking in hospital room?: A Little Help needed climbing 3-5 steps with a railing? : A Lot 6 Click Score: 15    End of Session Equipment Utilized During Treatment: Gait belt Activity Tolerance: Patient tolerated treatment well;Patient limited by fatigue Patient left: in chair;with call bell/phone within reach;with chair alarm set Nurse Communication: Mobility status PT Visit Diagnosis: Unsteadiness on feet (R26.81);Muscle weakness (generalized) (M62.81);Difficulty in walking, not elsewhere classified (R26.2)     Time: [id number] PT Time Calculation (min) (ACUTE ONLY): 20 min  Charges:  $Gait Training: 8-22 mins                     Eric Calderon, PT Pager: [phone] 07/17/2018    [facility] [email or web address],Shuntay Calderon 07/17/2018, 1:07 PM

## 2018-07-17 NOTE — Progress Notes (Signed)
PROGRESS NOTE  Eric Calderon  ZOX:[id number] DOB: April 19, 1941 DOA: 07/14/2018 PCP: Marva Panda, NP   Brief Narrative: Eric Calderon is a 77 y.o. male with a history of CAD s/p CABG, HTN, HLD, T2DM, AFib, COPD, and recent admission for blood loss anemia due to upper GI bleeding. He was discharged to SNF after admission 7/16-7/19 for acute blood loss anemia and upper GI bleeding in the setting of supratherapeutic INR, status post 3 units of packed red blood cells, 2FFP, status post EGD on 7/17 that showed minimal clots in the stomach, started on eliquis post discharge who presented back to the ED from his SNF w/ urinary retention, a UTI, and hypokalemia. Hematuria was noted on admission, but cleared up after placement of a foley and IV fluids. Voiding trial 7/25. PT has continued to recommend SNF and pt's mobility has improved.  Assessment & Plan: Principal Problem:   Acute urinary retention Active Problems:   Hyperlipidemia with target LDL less than 70   Essential hypertension   Chronic atrial fibrillation (HCC); CHA2DS2-VASc Score - 4   3-vessel CAD - 70% ISR and circumflex, diffuse 87 the RCA (nondominant), severe bifurcation LAD diagonal lesion 80 and 60% --> sent for CABG x4   Emphysema lung (HCC)   Moderate mitral regurgitation   Diabetes mellitus without complication (HCC)   Symptomatic anemia   COPD (chronic obstructive pulmonary disease) (HCC)   GI bleeding   Leukocytosis   Malnutrition of moderate degree   Gross hematuria   Urine retention   Urinary retention   Protein-calorie malnutrition, severe  Frequent falls:  - PT/OT, will require assistive devices and SNF therapy.   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 Low suspicion for gout with low uric acid and low suspicion for septic joint. XR with OA, chondrocalcinosis.  - Continue ice.  - Orthopedics consulted for consideration of steroid injection. Fortunately, the patient has improved  significantly so may not need further Tx/work up.  Left olecranon bursitis complicated by cellulitis: Suspect initial insult was trauma from fall.  - Ice, elevation, continue ceftriaxone. Improving significantly. Will likely need to transition to po in next 24-48hrs.  Acute urinary retention: Possibly due to pain and/or hemorrhagic cystitis/UTI. - Continue ceftriaxone as above (neg culture).  - Voiding trial 7/25.  Recent acute blood loss anemia due to upper GI bleed: With minimal clots in stomach on EGD 7/17 showing no signs of active bleeding. Barrett's esophagus noted.  - Was not discharged on PPI. This will need to be continued here and at discharge.  - Hgb has been stable since discharge despite reports of hematuria and melena (which the pt currently denies).   - Follow up with [facility], Dr. Ewing Schlein to discuss colonoscopy (pt declined at last hospitalization)  Chronic AFib:  - Continue metoprolol and eliquis.   Stage III CKD:  - Monitor SCr  HTN: Currently soft BP - Continue metoprololas tolerated  COPD: With reported >100 pack-year smoking history.  - Incentive spirometry, ambulate as able - Duonebs prn  T2DM: HbA1c 7.4%.  - Holding oral medications janumet, glipizide - SSI  Hyperlipidemia:  - Continue statin  DVT prophylaxis: Eliquis Code Status: DNR Family Communication: None at bedside Disposition Plan: SNF likely in next 24-48 hours. Voiding trial today. Ortho consulted.  Consultants:   Orthopedics  Procedures:   None  Antimicrobials:  Ceftriaxone    Subjective: Much better mobility today, pain mostly improved on left knee. Left elbow pain and swelling improved  as well. No fevers. No melena, hematochezia or hematuria.  Objective: Vitals:   07/16/18 2122 07/17/18 0509 07/17/18 0815 07/17/18 1407  BP: 115/73 117/62 119/67 108/61  Pulse: 93 67 (!) 105 70  Resp: 18 18 18 18   Temp: 99.1 F (37.3 C) 97.7 F (36.5 C) 97.7 F (36.5 C) 97.6 F  (36.4 C)  TempSrc: Oral  Oral Oral  SpO2: 100% 99% 98% 100%  Weight:  72.6 kg (160 lb 0.9 oz)    Height:        Intake/Output Summary (Last 24 hours) at 07/17/2018 1455 Last data filed at 07/17/2018 1400 Gross per 24 hour  Intake -  Output 2950 ml  Net -2950 ml   Filed Weights   07/15/18 0529 07/16/18 0500 07/17/18 0509  Weight: 71.8 kg (158 lb 4.6 oz) 71.7 kg (158 lb 1.1 oz) 72.6 kg (160 lb 0.9 oz)   Gen: 77 y.o. male in no distress Pulm: Nonlabored breathing room air. Clear/diminished. CV: Irreg irreg, no murmur, rub, or gallop. No JVD, no dependent edema. GI: Abdomen soft, non-tender, non-distended, with normoactive bowel sounds.  Ext: Left knee without erythema or warmth, tender on lateral aspects without patellar compression tenderness. No significant effusion. Bony hypertrophy noted. Skin: Left elbow with tender olecranon bursitis and proximally extending erythema with almost no more tenderness. Swelling is improved significantly.   Neuro: Alert and oriented. No focal neurological deficits. Psych: Judgement and insight appear fair. Mood euthymic & affect congruent. Behavior is appropriate.    Data Reviewed: I have personally reviewed following labs and imaging studies  CBC: Recent Labs  Lab 07/14/18 0239 07/14/18 1049 07/14/18 1645 07/14/18 2037 07/15/18 0342 07/16/18 0604  WBC 15.5* 13.8* 13.6* 13.1* 11.9* 10.0  NEUTROABS 13.3*  --   --   --   --   --   HGB 9.8* 10.2* 9.0* 9.2* 8.9* 9.0*  HCT 29.3* 30.9* 27.2* 27.5* 27.1* 28.5*  MCV 88.8 89.0 89.5 89.6 89.4 92.8  PLT 628* 677* 609* 608* 613* 675*   Basic Metabolic Panel: Recent Labs  Lab 07/11/18 0445 07/12/18 0542 07/14/18 0239 07/15/18 0342 07/16/18 0604  NA 135 130* 128* 131* 133*  K 3.5 3.7 3.1* 3.7 4.1  CL 105 103 95* 103 105  CO2 23 23 24 23 23   GLUCOSE 91 112* 177* 123* 143*  BUN 26* 19 16 15 14   CREATININE 1.20 1.14 1.61 0.97 0.96  CALCIUM 7.8* 7.5* 7.6* 7.3* 7.4*  MG 1.9  --   --   --   --      GFR: Estimated Creatinine Clearance: 63.3 mL/min (by C-G formula based on SCr of 0.96 mg/dL). Liver Function Tests: Recent Labs  Lab 07/14/18 0239 07/16/18 0604  AST 70* 36  ALT 34 21  ALKPHOS 90 86  BILITOT 3.0* 1.8*  PROT 6.0* 5.2*  ALBUMIN 1.9* 1.6*   No results for input(s): LIPASE, AMYLASE in the last 168 hours. No results for input(s): AMMONIA in the last 168 hours. Coagulation Profile: Recent Labs  Lab 07/14/18 0239 07/15/18 0342  INR 2.45 1.68   Cardiac Enzymes: No results for input(s): CKTOTAL, CKMB, CKMBINDEX, TROPONINI in the last 168 hours. BNP (last 3 results) No results for input(s): PROBNP in the last 8760 hours. HbA1C: No results for input(s): HGBA1C in the last 72 hours. CBG: Recent Labs  Lab 07/15/18 2141 07/16/18 0759 07/16/18 1210 07/16/18 1642 07/16/18 2120  GLUCAP 157* 117* 119* 107* 129*   Lipid Profile: No results for input(s): CHOL,  HDL, LDLCALC, TRIG, CHOLHDL, LDLDIRECT in the last 72 hours. Thyroid Function Tests: No results for input(s): TSH, T4TOTAL, FREET4, T3FREE, THYROIDAB in the last 72 hours. Anemia Panel: No results for input(s): VITAMINB12, FOLATE, FERRITIN, TIBC, IRON, RETICCTPCT in the last 72 hours. Urine analysis:    Component Value Date/Time   COLORURINE RED (A) 07/14/2018 0244   APPEARANCEUR TURBID (A) 07/14/2018 0244   LABSPEC 1.015 07/14/2018 0244   PHURINE 6.5 07/14/2018 0244   GLUCOSEU >=500 (A) 07/14/2018 0244   HGBUR LARGE (A) 07/14/2018 0244   BILIRUBINUR MODERATE (A) 07/14/2018 0244   KETONESUR 15 (A) 07/14/2018 0244   PROTEINUR >300 (A) 07/14/2018 0244   NITRITE POSITIVE (A) 07/14/2018 0244   LEUKOCYTESUR MODERATE (A) 07/14/2018 0244   Recent Results (from the past 240 hour(s))  MRSA PCR Screening     Status: None   Collection Time: 07/08/18  6:10 PM  Result Value Ref Range Status   MRSA by PCR NEGATIVE NEGATIVE Final    Comment:        The GeneXpert MRSA Assay (FDA approved for NASAL  specimens only), is one component of a comprehensive MRSA colonization surveillance program. It is not intended to diagnose MRSA infection nor to guide or monitor treatment for MRSA infections. Performed at [facility], [address], [place], [place] [address]   Culture, blood (Routine x 2)     Status: None (Preliminary result)   Collection Time: 07/14/18  2:39 AM  Result Value Ref Range Status   Specimen Description BLOOD RIGHT ARM  Final   Special Requests   Final    BOTTLES DRAWN AEROBIC ONLY Blood Culture results may not be optimal due to an inadequate volume of blood received in culture bottles   Culture   Final    NO GROWTH 3 DAYS Performed at [facility], [address], [place], [place] [address]    Report Status PENDING  Incomplete  Urine culture     Status: None   Collection Time: 07/14/18  2:39 AM  Result Value Ref Range Status   Specimen Description URINE, RANDOM  Final   Special Requests NONE  Final   Culture   Final    NO GROWTH Performed at [facility], [address], [place], [place] [address]    Report Status 07/15/2018 FINAL  Final  Culture, blood (Routine x 2)     Status: None (Preliminary result)   Collection Time: 07/14/18  2:41 AM  Result Value Ref Range Status   Specimen Description BLOOD RIGHT UPPER ARM  Final   Special Requests   Final    BOTTLES DRAWN AEROBIC AND ANAEROBIC Blood Culture adequate volume   Culture   Final    NO GROWTH 3 DAYS Performed at [facility], [address] [place], [place] [address]    Report Status PENDING  Incomplete      Radiology Studies: Dg Knee 4 Views W/patella Left  Result Date: 07/16/2018 CLINICAL DATA:  Two week history of LEFT knee pain. Multiple recent falls. Initial encounter. EXAM: LEFT KNEE - COMPLETE 4+ VIEW COMPARISON:  None. FINDINGS: No evidence of acute or subacute fracture or dislocation. Mild tricompartment joint space narrowing. Chondrocalcinosis involving the  LATERAL meniscus and to a lesser degree the MEDIAL meniscus. No visible joint effusion. Dystrophic calcifications involving the patellar tendon near its insertion on the patella. No evidence of a tracking abnormality on the patellar sunrise view. Femoropopliteal and tibioperoneal artery atherosclerosis. IMPRESSION:  1. No acute or subacute osseous abnormality. 2. Mild tricompartment osteoarthritis. 3. CPPD with chondrocalcinosis involving the menisci. 4. Dystrophic calcifications involving the patellar tendon near its insertion on the patella, possibly related to prior injury/tear. Electronically Signed   By: Hulan Saashomas  Lawrence M.D.   On: 07/16/2018 17:17    Scheduled Meds: . apixaban  5 mg Oral BID  . bupivacaine  10 mL Infiltration Once  . fenofibrate  160 mg Oral Daily  . insulin aspart  0-9 Units Subcutaneous TID WC  . methylPREDNISolone acetate  40 mg Intra-articular Once  . metoprolol tartrate  25 mg Oral BID  . pantoprazole  40 mg Oral BID  . potassium chloride  10 mEq Oral Daily  . simvastatin  40 mg Oral q1800   Continuous Infusions: . sodium chloride 60 mL/hr at 07/17/18 0043  . cefTRIAXone (ROCEPHIN) IVPB 1 gram/100 mL NS (Mini-Bag Plus) Stopped (07/17/18 0700)     LOS: 2 days   Time spent: 25 minutes.  Tyrone Nineyan B Grunz, MD [facility] [email or web address] Password [email or web address] 07/17/2018, 2:55 PM

## 2018-07-17 NOTE — Progress Notes (Signed)
Occupational Therapy Treatment Patient Details Name: Eric Calderon MRN: [id number] DOB: 24-Mar-1941 Today's Date: 07/17/2018    History of present illness Eric Calderon is a 77 y.o. male with medical history significant make A. Fib on Coumadin, CAD status post CABG 4 2008, hyperlipidemia, hypertension, diabetes, COPD not on home oxygen presents to the emergency Department chief complaint of persistent worsening generalized weakness with multiple falls. Initial evaluation reveals likely GI bleed secondary to supratherapeutic INR., L knee and elbow pain   OT comments  Session limited by orthostatis. 100/73 sitting; 77/45 standing (symtomatic). Recommend trying TED hose. Will continue to follow acutely to facilitate safe DC to SNF for rehab.   Follow Up Recommendations  Supervision/Assistance - 24 hour;SNF    Equipment Recommendations  Tub/shower seat;3 in 1 bedside commode    Recommendations for Other Services      Precautions / Restrictions Precautions Precautions: Fall Restrictions Weight Bearing Restrictions: No       Mobility Bed Mobility Overal bed mobility: Needs Assistance Bed Mobility: Supine to Sit     Supine to sit: Supervision     General bed mobility comments: OOB in chair  Transfers Overall transfer level: Needs assistance Equipment used: Rolling walker (2 wheeled) Transfers: Sit to/from [employer] Sit to Stand: Min assist         General transfer comment: min assist for anterior wt translation and to transition to RW, cues for overall safety and hand placement    Balance     Sitting balance-Leahy Scale: Fair     Standing balance support: Bilateral upper extremity supported;During functional activity Standing balance-Leahy Scale: Poor Standing balance comment: reliant on UEs                           ADL either performed or assessed with clinical judgement   ADL Overall ADL's : Needs assistance/impaired                                        General ADL Comments: limited session due to orthostatics     Vision       Perception     Praxis      Cognition Arousal/Alertness: Awake/alert Behavior During Therapy: WFL for tasks assessed/performed Overall Cognitive Status: Within Functional Limits for tasks assessed Area of Impairment: Awareness;Problem solving;Safety/judgement                               General Comments: slow processing        Exercises Other Exercises Other Exercises: encouraged BUE general AROM/strengthening while OOB in chair   Shoulder Instructions       General Comments      Pertinent Vitals/ Pain       Pain Assessment: Faces Faces Pain Scale: Hurts a little bit Pain Location: L knee Pain Descriptors / Indicators: Guarding;Grimacing;Sore Pain Intervention(s): Limited activity within patient's tolerance  Home Living                                          Prior Functioning/Environment              Frequency  Min 2X/week        Progress Toward Goals  OT Goals(current goals can now be found in the care plan section)  Progress towards OT goals: Progressing toward goals  Acute Rehab OT Goals Patient Stated Goal: to get stronger in his legs OT Goal Formulation: With patient Time For Goal Achievement: 07/29/18 Potential to Achieve Goals: Good ADL Goals Pt Will Perform Grooming: with modified independence;sitting;standing Pt Will Perform Upper Body Dressing: with modified independence Pt Will Perform Lower Body Dressing: with modified independence Pt Will Transfer to Toilet: with modified independence;ambulating Additional ADL Goal #1: Pt will demonstrate understanding of 3 energy conservation strategies. Additional ADL Goal #2: Pt will demonstrate understanding of 3 fall prevention strategies.  Plan Discharge plan remains appropriate    Co-evaluation                 AM-PAC PT "6  Clicks" Daily Activity     Outcome Measure   Help from another person eating meals?: None Help from another person taking care of personal grooming?: A Little Help from another person toileting, which includes using toliet, bedpan, or urinal?: A Lot Help from another person bathing (including washing, rinsing, drying)?: A Lot Help from another person to put on and taking off regular upper body clothing?: A Little Help from another person to put on and taking off regular lower body clothing?: A Lot 6 Click Score: 16    End of Session Equipment Utilized During Treatment: Gait belt;Rolling walker  OT Visit Diagnosis: Other abnormalities of gait and mobility (R26.89);Unsteadiness on feet (R26.81);Muscle weakness (generalized) (M62.81);Pain;History of falling (Z91.81) Pain - Right/Left: Left Pain - part of body: Knee   Activity Tolerance Treatment limited secondary to medical complications (Comment)(BP)   Patient Left in chair;with call bell/phone within reach;with chair alarm set   Nurse Communication Mobility status;Other (comment)        Time: 1438-1500 OT Time Calculation (min): 22 min  Charges: OT General Charges $OT Visit: 1 Visit OT Treatments $Self Care/Home Management : 8-22 mins  Luisa DagoHilary Leston Schueller, OT/L  OT Clinical Specialist [phone]    [facility] HospitalWARD,HILLARY 07/17/2018, 3:17 PM

## 2018-07-17 NOTE — Consult Note (Signed)
Reason for Consult:Left knee pain Referring Physician: R Oliverio Calderon is an 77 y.o. male.  HPI: Eric Calderon was admitted just over a week ago with urinary retention. He is on chronic anticoagulation and has had recent bleeding complications. He notes left knee pain ever since admission. He also notes associated swelling with it. He denies trauma to the knee but did have a fall prior to admission. He has a long history of gout but says this pain is nowhere near as bad as his gout attacks. He denies prior hx/o similar symptoms. The pain has been so severe he's been unable to ambulate. He notes that after icing it recently that the pain is about 70% better.  Past Medical History:  Diagnosis Date  . Atrial fibrillation with rapid ventricular response ([place])   . CAD S/P percutaneous coronary angioplasty October 2006   CATH: 80% MID LAD, involving diagonal bifurcation; 99%  AV GROOVE  CIRCUMFLEX --> PCI of circumflex with 3.0 mm 13 and a Cypher DES; planned staged LAD PCI  . CAD, multiple vessel November 2007   LAD lesion persists, 80% pre-D1 with 60% D1 and distal down the one lesion. 70% ISR and circumflex, L. PDA 80%; nondominant RCA - diffuse 80-70%  . Chronic atrial fibrillation (HCC)    Rate control with beta blocker, anticoagulated warfarin. This patients CHA2DS2-VASc Score and unadjusted Ischemic Stroke Rate (% per year) is equal to 3.2 % stroke rate/year from a score of 3  . COPD (chronic obstructive pulmonary disease) (HCC)    Greater than 100 pack year smoking history, quit 2009  . Diabetes mellitus without complication ([place])    9242  . Essential hypertension   . Exertional dyspnea Since 2006   a) 8/'11 Myoview: No Ischemic/Infarct; Echo: Nl LV size & fxn, EF 55-60%, mod-Severe LA & RA dilation,mild AoV sclerosis, Mild PHTN;; 4/'14 Echo: LV EF  55-60%, aortic sclerosis, moderate MR, moderate to severe LA and RA dilation, increased RV pressure -mild PHtn; 9/'15: Lexiscan Myovew: No  Ischmia or Infarct, Afib - no EF.  . GI bleed   . Hyperlipidemia with target LDL less than 70   . Mild mitral regurgitation by prior echocardiography 03/2013   Echo 2019:  Mild MR  . Rib fracture   . 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 Supratherapeutic INR   . Symptomatic anemia     Past Surgical History:  Procedure Laterality Date  . CARDIAC CATHETERIZATION  2007   CONCLUSION SIGNIFICANT THREE VESSEL CAD  2.NORMAL LEFT VENTRICULAR SYSTOLIC FUNCTION  . CORONARY ARTERY BYPASS GRAFT  2008   x4  . ESOPHAGOGASTRODUODENOSCOPY (EGD) WITH PROPOFOL N/A 07/09/2018   Procedure: ESOPHAGOGASTRODUODENOSCOPY (EGD) WITH PROPOFOL;  Surgeon: Clarene Essex, MD;  Location: [facility];  Service: Endoscopy;  Laterality: N/A;  . NM MYOVIEW LTD  September 2015   Non-gated. Low risk. No ischemia or infarct  . TRANSTHORACIC ECHOCARDIOGRAM  04/02/2013   EF 55-60%. No regional W. May. Moderate MR, moderate to severely dilated LA. Mild RV pressure increase with moderate RA dilation. Moderate TR.  [name] TRANSTHORACIC ECHOCARDIOGRAM  05/2018   Normal LV size.  Moderate LVH.  EF 55-60% with no regional wall motion abnormality.  Unable to assess diastolic function.  Severe biatrial enlargement.  Mild MR    Family History  Problem Relation Age of Onset  . Stroke Mother   . Cancer Father   . Heart disease Sister     Social History:  reports that he quit smoking about 10 years ago. He has a 110.00 pack-year smoking history. He has never used smokeless tobacco. He reports that he drinks about 0.6 oz of alcohol per week. He reports that he does not use drugs.  Allergies:  Allergies  Allergen Reactions  . Niaspan [Niacin Er]     Medications: I have reviewed the patient's current medications.  Results for orders placed or performed during the hospital encounter of 07/14/18 (from the past 48 hour(s))  Glucose, capillary     Status: Abnormal   Collection Time: 07/15/18 12:15 PM   Result Value Ref Range   Glucose-Capillary 131 (H) 70 - 99 mg/dL  Glucose, capillary     Status: Abnormal   Collection Time: 07/15/18  4:59 PM  Result Value Ref Range   Glucose-Capillary 147 (H) 70 - 99 mg/dL  Glucose, capillary     Status: Abnormal   Collection Time: 07/15/18  9:41 PM  Result Value Ref Range   Glucose-Capillary 157 (H) 70 - 99 mg/dL  Comprehensive metabolic panel     Status: Abnormal   Collection Time: 07/16/18  6:04 AM  Result Value Ref Range   Sodium 133 (L) 135 - 145 mmol/L   Potassium 4.1 3.5 - 5.1 mmol/L   Chloride 105 98 - 111 mmol/L   CO2 23 22 - 32 mmol/L   Glucose, Bld 143 (H) 70 - 99 mg/dL   BUN 14 8 - 23 mg/dL   Creatinine, Ser 0.96 0.61 - 1.24 mg/dL   Calcium 7.4 (L) 8.9 - 10.3 mg/dL   Total Protein 5.2 (L) 6.5 - 8.1 g/dL   Albumin 1.6 (L) 3.5 - 5.0 g/dL   AST 36 15 - 41 U/L   ALT 21 0 - 44 U/L   Alkaline Phosphatase 86 38 - 126 U/L   Total Bilirubin 1.8 (H) 0.3 - 1.2 mg/dL   GFR calc non Af Amer >60 >60 mL/min   GFR calc Af Amer >60 >60 mL/min    Comment: (NOTE) The eGFR has been calculated using the CKD EPI equation. This calculation has not been validated in all clinical situations. eGFR's persistently <60 mL/min signify possible Chronic Kidney Disease.    Anion gap 5 5 - 15    Comment: Performed at [facility] [address], [place], [place] [address]  CBC     Status: Abnormal   Collection Time: 07/16/18  6:04 AM  Result Value Ref Range   WBC 10.0 4.0 - 10.5 K/uL   RBC 3.07 (L) 4.22 - 5.81 MIL/uL   Hemoglobin 9.0 (L) 13.0 - 17.0 g/dL   HCT 28.5 (L) 39.0 - 52.0 %   MCV 92.8 78.0 - 100.0 fL   MCH 29.3 26.0 - 34.0 pg   MCHC 31.6 30.0 - 36.0 g/dL   RDW 15.9 (H) 11.5 - 15.5 %   Platelets 675 (H) 150 - 400 K/uL    Comment: Performed at [facility], [place] [address], [place], [place] [address]  Uric acid     Status: Abnormal   Collection Time: 07/16/18  6:04 AM  Result Value Ref Range   Uric Acid, Serum 2.3 (L) 3.7 - 8.6  mg/dL    Comment: Performed at [facility] [address] [place], [place] [address]  Glucose, capillary     Status: Abnormal   Collection Time: 07/16/18  7:59 AM  Result Value Ref Range   Glucose-Capillary 117 (H) 70 - 99 mg/dL  Glucose, capillary     Status: Abnormal   Collection Time: 07/16/18 12:10 PM  Result Value Ref Range   Glucose-Capillary 119 (H) 70 - 99 mg/dL  Glucose, capillary     Status: Abnormal   Collection Time: 07/16/18  4:42 PM  Result Value Ref Range   Glucose-Capillary 107 (H) 70 - 99 mg/dL  Glucose, capillary     Status: Abnormal   Collection Time: 07/16/18  9:20 PM  Result Value Ref Range   Glucose-Capillary 129 (H) 70 - 99 mg/dL    Dg Knee 4 Views W/patella Left  Result Date: 07/16/2018 CLINICAL DATA:  Two week history of LEFT knee pain. Multiple recent falls. Initial encounter. EXAM: LEFT KNEE - COMPLETE 4+ VIEW COMPARISON:  None. FINDINGS: No evidence of acute or subacute fracture or dislocation. Mild tricompartment joint space narrowing. Chondrocalcinosis involving the LATERAL meniscus and to a lesser degree the MEDIAL meniscus. No visible joint effusion. Dystrophic calcifications involving the patellar tendon near its insertion on the patella. No evidence of a tracking abnormality on the patellar sunrise view. Femoropopliteal and tibioperoneal artery atherosclerosis. IMPRESSION: 1. No acute or subacute osseous abnormality. 2. Mild tricompartment osteoarthritis. 3. CPPD with chondrocalcinosis involving the menisci. 4. Dystrophic calcifications involving the patellar tendon near its insertion on the patella, possibly related to prior injury/tear. Electronically Signed   By: Evangeline Dakin M.D.   On: 07/16/2018 17:17    Review of Systems  Constitutional: Negative for weight loss.  HENT: Negative for ear discharge, ear pain, hearing loss and tinnitus.   Eyes: Negative for blurred vision, double vision, photophobia and pain.  Respiratory: Negative for  cough, sputum production and shortness of breath.   Cardiovascular: Negative for chest pain.  Gastrointestinal: Negative for abdominal pain, nausea and vomiting.  Genitourinary: Negative for dysuria, flank pain, frequency and urgency.  Musculoskeletal: Positive for joint pain (Left knee, left elbow/upper arm). Negative for back pain, falls, myalgias and neck pain.  Neurological: Negative for dizziness, tingling, sensory change, focal weakness, loss of consciousness and headaches.  Endo/Heme/Allergies: Does not bruise/bleed easily.  Psychiatric/Behavioral: Negative for depression, memory loss and substance abuse. The patient is not nervous/anxious.    Blood pressure 119/67, pulse (!) 105, temperature 97.7 F (36.5 C), temperature source Oral, resp. rate 18, height '5\' 8"'$  (1.727 m), weight 72.6 kg (160 lb 0.9 oz), SpO2 98 %. Physical Exam  Constitutional: He appears well-developed and well-nourished. No distress.  HENT:  Head: Normocephalic and atraumatic.  Eyes: Conjunctivae are normal. Right eye exhibits no discharge. Left eye exhibits no discharge. No scleral icterus.  Neck: Normal range of motion.  Cardiovascular: Normal rate and regular rhythm.  Respiratory: Effort normal. No respiratory distress.  Musculoskeletal:  LLE No traumatic wounds, ecchymosis, or rash  Mod TTP lateral and medial joint line  Mild knee effusion, no ankle effusion  Knee stable to varus/ valgus and anterior/posterior stress, minimal pain   Sens DPN, SPN, TN intact  Motor EHL, ext, flex, evers 5/5  DP 2+, PT 2+, No significant edema  Neurological: He is alert.  Skin: Skin is warm and dry. He is not diaphoretic.  Psychiatric: He has a normal mood and affect. His behavior is normal.    Assessment/Plan: Left knee pain -- The differential is broad here. OA, reactive arthritis, hemarthrosis, and gout are all possible etiologies. Given the recent improvement in his symptoms and the risk of causing an iatrogenic  hemarthrosis I have suggested deferring joint aspiration/injection until we see if he is able  to ambulate. If so I would recommend continued ice and compression with an ACE. If not then I think we should proceed with the injection. RN to call me after mobilization for final determination. He may f/u with Dr. Marcelino Scot as an OP if symptoms persist.    Lisette Abu, PA-C Orthopedic Surgery [phone] 07/17/2018, 10:29 AM

## 2018-07-18 LAB — GLUCOSE, CAPILLARY
GLUCOSE-CAPILLARY: 100 mg/dL — AB (ref 70–99)
GLUCOSE-CAPILLARY: 148 mg/dL — AB (ref 70–99)

## 2018-07-18 MED ORDER — CEPHALEXIN 500 MG PO CAPS: 500.0000 mg | ORAL_CAPSULE | Freq: Three times a day (TID) | ORAL | 0 refills | Status: DC

## 2018-07-18 NOTE — Discharge Summary (Signed)
Physician Discharge Summary  Eric MorseRobert R Calderon WGN:[id number] DOB: 1941/06/10 DOA: 07/14/2018  PCP: Marva PandaMillsaps, Kimberly, NP  Admit date: 07/14/2018 Discharge date: [place]  Admitted From: SNF Disposition: SNF   Recommendations for Outpatient Follow-up:  1. Follow up with PCP in 1-2 weeks 2. Please obtain BMP/CBC in one week 3. Continue keflex for LUE cellulitis. Probably 5 more days Tx, or based on clinical response.  Home Health: N/A Equipment/Devices: Per SNF Discharge Condition: Stable CODE STATUS: DNR Diet recommendation: Heart healthy, carb-modified  Brief/Interim Summary: Eric MorseRobert R Calderon is a 77 y.o. male with a history of CAD s/p CABG, HTN, HLD, T2DM, AFib, COPD, and recent admission for blood loss anemia due to upper GI bleeding. He was discharged to SNF after admission 7/16-7/19 for acute blood loss anemia and upper GI bleeding in the setting of supratherapeutic INR, status post 3 units of packed red blood cells, 2FFP, status post EGD on 7/17 that showed minimal clots in the stomach, started on eliquis post discharge who presented backto the ED from hisSNFw/urinary retention,aUTI, andhypokalemia. Hematuriawasnoted on admission,butcleared up after placement ofa foley and IV fluids. Voiding trial performed 7/25 was successful with spontaneous voiding since that time. PT has continued to recommend SNF and pt's mobility has improved.  Discharge Diagnoses:  Principal Problem:   Acute urinary retention Active Problems:   Hyperlipidemia with target LDL less than 70   Essential hypertension   Chronic atrial fibrillation (HCC); CHA2DS2-VASc Score - 4   3-vessel CAD - 70% ISR and circumflex, diffuse 87 the RCA (nondominant), severe bifurcation LAD diagonal lesion 80 and 60% --> sent for CABG x4   Emphysema lung (HCC)   Moderate mitral regurgitation   Diabetes mellitus without complication (HCC)   Symptomatic anemia   COPD (chronic obstructive pulmonary disease) (HCC)   GI  bleeding   Leukocytosis   Malnutrition of moderate degree   Gross hematuria   Urine retention   Urinary retention   Protein-calorie malnutrition, severe  Frequent falls:  - PT/OT, will require assistive devices and SNF therapy.   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 Low suspicion for gout with low uric acid and low suspicion for septic joint. XR with OA, chondrocalcinosis.  - Continue ice application - Orthopedics consulted for consideration of steroid injection. Fortunately, the patient has improved significantly so may not need further Tx/work up.  Left olecranon bursitis complicated by cellulitis: Suspect initial insult was trauma from fall.  - Ice, elevation, continue keflex for nonpurulent cellulitis. Improving significantly.   Acute urinary retention: Possibly due to pain and/or hemorrhagic cystitis/UTI. - Resolved. If recurs, would need urology referral. - Continued abx as above for cellulitis (neg urine culture).   Recent acute blood loss anemia due to upper GI bleed: With minimal clots in stomach on EGD 7/17 showing no signs of active bleeding. Barrett's esophagus noted.  - PPI continued here and at discharge.  - Hgb has been stable since discharge despite reports of hematuria and melena (which the pt currently denies).   - Follow up with [facility], Dr. Ewing SchleinMagod to discuss colonoscopy (pt declined at last hospitalization)  Chronic AFib:  - Continue metoprolol and eliquis.   Stage III CKD:  - Monitor SCr  HTN: Currently soft BP - Continue metoprololas tolerated  COPD: With reported >100 pack-year smoking history.  - Incentive spirometry, ambulate  - Duonebs prn  T2DM: HbA1c 7.4%.  - Restart previous medications  Hyperlipidemia:  - Continue statin  Discharge Instructions Discharge Instructions  Diet - low sodium heart healthy   Complete by:  As directed    Diet Carb Modified   Complete by:  As directed    Increase  activity slowly   Complete by:  As directed      Allergies as of [place]      Reactions   Niaspan [niacin Er] Other (See Comments)   Reaction not noted, but on MAR at facility      Medication List    TAKE these medications   acetaminophen 500 MG tablet Commonly known as:  TYLENOL Take 500 mg by mouth every 8 (eight) hours as needed for mild pain, fever or headache.   allopurinol 100 MG tablet Commonly known as:  ZYLOPRIM Take 1 tablet by mouth daily as needed (for gout).   apixaban 5 MG Tabs tablet Commonly known as:  ELIQUIS Take 1 tablet (5 mg total) by mouth 2 (two) times daily.   cephALEXin 500 MG capsule Commonly known as:  KEFLEX Take 1 capsule (500 mg total) by mouth 3 (three) times daily.   fenofibrate 160 MG tablet Take 160 mg by mouth daily.   furosemide 40 MG tablet Commonly known as:  LASIX TAKE 1 TABLET(40 MG) BY MOUTH DAILY   glipiZIDE 10 MG 24 hr tablet Commonly known as:  GLUCOTROL XL Take by mouth daily with breakfast.   indomethacin 75 MG CR capsule Commonly known as:  INDOCIN SR Take 75 mg by mouth 2 (two) times daily as needed for mild pain (gout symptoms).   JANUMET XR 50-1000 MG Tb24 Generic drug:  SitaGLIPtin-MetFORMIN HCl Take 1 tablet by mouth daily.   LANTUS 100 UNIT/ML injection Generic drug:  insulin glargine Inject 10 Units into the skin at bedtime as needed (for BGL 200 or greater).   metoprolol tartrate 25 MG tablet Commonly known as:  LOPRESSOR Take 0.5 tablets (12.5 mg total) by mouth 2 (two) times daily.   potassium chloride 10 MEQ tablet Commonly known as:  K-DUR,KLOR-CON Take 1 tablet (10 mEq total) by mouth daily.   simvastatin 40 MG tablet Commonly known as:  ZOCOR TAKE 1 TABLET(40 MG) BY MOUTH AT BEDTIME       Contact information for follow-up providers    Marva Panda, NP Follow up.   Contact information: [facility] [address] [place] [address] [phone]             Contact information for after-discharge care    Destination    [facility] .   Service:  Skilled Nursing Contact information: [address] [place] [address] [phone]                 Allergies  Allergen Reactions  . Niaspan [Niacin Er] Other (See Comments)    Reaction not noted, but on MAR at facility    Consultations:  None  Procedures/Studies: Dg Chest 2 View  Result Date: 07/14/2018 CLINICAL DATA:  Fever. EXAM: CHEST - 2 VIEW COMPARISON:  07/08/2018 FINDINGS: Postoperative changes in the mediastinum. Heart size and pulmonary vascularity are normal. Small bilateral pleural effusions, greater on the left. Basilar atelectasis. No focal consolidation. No pneumothorax. Mediastinal contours appear intact. IMPRESSION: Small bilateral pleural effusions and basilar atelectasis, greater on the left. Electronically Signed   By: Burman Nieves M.D.   On: 07/14/2018 02:16   Dg Ribs Unilateral W/chest Left  Result Date: 07/08/2018 CLINICAL DATA:  Multiple falls over the last week, left lower rib pain  EXAM: LEFT RIBS AND CHEST - 3+ VIEW COMPARISON:  Chest x-ray of 02/23/2014 FINDINGS: No active infiltrate or effusion is seen. The lungs are slightly hyperaerated. Mediastinal and hilar contours are unremarkable. Median sternotomy sutures are noted from prior CABG. The heart size is stable. Left rib detail films show nondisplaced fractures of the very anterior left eighth and ninth ribs. IMPRESSION: 1. Nondisplaced fractures of the anterior left eighth and ninth ribs. 2. No active lung disease. Electronically Signed   By: Dwyane Dee M.D.   On: 07/08/2018 08:37   Ct Head Wo Contrast  Result Date: 07/08/2018 CLINICAL DATA:  Fall, headache, neck pain EXAM: CT HEAD WITHOUT CONTRAST CT CERVICAL SPINE WITHOUT CONTRAST TECHNIQUE: Multidetector CT imaging of the head and cervical spine was performed following the standard protocol without  intravenous contrast. Multiplanar CT image reconstructions of the cervical spine were also generated. COMPARISON:  None. FINDINGS: CT HEAD FINDINGS Brain: Diffuse cerebral atrophy. No acute intracranial abnormality. Specifically, no hemorrhage, hydrocephalus, mass lesion, acute infarction, or significant intracranial injury. Vascular: No hyperdense vessel or unexpected calcification. Skull: No acute calvarial abnormality. Sinuses/Orbits: Visualized paranasal sinuses and mastoids clear. Orbital soft tissues unremarkable. Other: None CT CERVICAL SPINE FINDINGS Alignment: No subluxation Skull base and vertebrae: No acute fracture. No primary bone lesion or focal pathologic process. Soft tissues and spinal canal: No prevertebral fluid or swelling. No visible canal hematoma. Disc levels: Disc space narrowing and spurring from C4-5 through C6-7. Upper chest: Biapical scarring Other: No acute findings.  Carotid artery calcifications. IMPRESSION: Diffuse cerebral atrophy.  No acute intracranial abnormality. 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 No acute bony abnormality. Electronically Signed   By: Charlett Nose M.D.   On: 07/08/2018 08:11   Ct Cervical Spine Wo Contrast  Result Date: 07/08/2018 CLINICAL DATA:  Fall, headache, neck pain EXAM: CT HEAD WITHOUT CONTRAST CT CERVICAL SPINE WITHOUT CONTRAST TECHNIQUE: Multidetector CT imaging of the head and cervical spine was performed following the standard protocol without intravenous contrast. Multiplanar CT image reconstructions of the cervical spine were also generated. COMPARISON:  None. FINDINGS: CT HEAD FINDINGS Brain: Diffuse cerebral atrophy. No acute intracranial abnormality. Specifically, no hemorrhage, hydrocephalus, mass lesion, acute infarction, or significant intracranial injury. Vascular: No hyperdense vessel or unexpected calcification. Skull: No acute calvarial abnormality. Sinuses/Orbits: Visualized paranasal sinuses and mastoids  clear. Orbital soft tissues unremarkable. Other: None CT CERVICAL SPINE FINDINGS Alignment: No subluxation Skull base and vertebrae: No acute fracture. No primary bone lesion or focal pathologic process. Soft tissues and spinal canal: No prevertebral fluid or swelling. No visible canal hematoma. Disc levels: Disc space narrowing and spurring from C4-5 through C6-7. Upper chest: Biapical scarring Other: No acute findings.  Carotid artery calcifications. IMPRESSION: Diffuse cerebral atrophy.  No acute intracranial abnormality. 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 No acute bony abnormality. Electronically Signed   By: Charlett Nose M.D.   On: 07/08/2018 08:11   Dg Knee 4 Views W/patella Left  Result Date: 07/16/2018 CLINICAL DATA:  Two week history of LEFT knee pain. Multiple recent falls. Initial encounter. EXAM: LEFT KNEE - COMPLETE 4+ VIEW COMPARISON:  None. FINDINGS: No evidence of acute or subacute fracture or dislocation. Mild tricompartment joint space narrowing. Chondrocalcinosis involving the LATERAL meniscus and to a lesser degree the MEDIAL meniscus. No visible joint effusion. Dystrophic calcifications involving the patellar tendon near its insertion on the patella. No evidence of a tracking abnormality on the patellar sunrise view. Femoropopliteal and  tibioperoneal artery atherosclerosis. IMPRESSION: 1. No acute or subacute osseous abnormality. 2. Mild tricompartment osteoarthritis. 3. CPPD with chondrocalcinosis involving the menisci. 4. Dystrophic calcifications involving the patellar tendon near its insertion on the patella, possibly related to prior injury/tear. Electronically Signed   By: Hulan Saas M.D.   On: 07/16/2018 17:17     Subjective: Walked around a lot in the room yesterday. When getting up with OT felt dizzy with orthostatic symptoms. This happens from time to time chronically. No bleeding, hematuria, difficulty voiding.   Discharge Exam: Vitals:    07/17/18 2113 07/18/18 0500  BP: 130/82 115/67  Pulse: 94 79  Resp: 18 18  Temp: 98 F (36.7 C) 98.3 F (36.8 C)  SpO2: 97% 98%   General: Pt is alert, awake, not in acute distress Cardiovascular: Irreg, S1/S2 +, no rubs, no gallops Respiratory: CTA bilaterally, no wheezing, no rhonchi Abdominal: Soft, NT, ND, bowel sounds + Extremities: No edema, no cyanosis. Knees with bony hypertrophy, left knee tender on medial and lateral joint lines without effusion, erythema, warmth. +crepitance. Left elbow with boggy olecranon bursa, no erythema there. There is erythema extending proximally which is associated with some swelling focally and tenderness all of which has improved. Also has scattered ecchymoses, including dorsal right leg.   Labs: BNP (last 3 results) No results for input(s): BNP in the last 8760 hours. Basic Metabolic Panel: Recent Labs  Lab 07/12/18 0542 07/14/18 0239 07/15/18 0342 07/16/18 0604  NA 130* 128* 131* 133*  K 3.7 3.1* 3.7 4.1  CL 103 95* 103 105  CO2 23 24 23 23   GLUCOSE 112* 177* 123* 143*  BUN 19 16 15 14   CREATININE 1.14 1.15 0.97 0.96  CALCIUM 7.5* 7.6* 7.3* 7.4*   Liver Function Tests: Recent Labs  Lab 07/14/18 0239 07/16/18 0604  AST 70* 36  ALT 34 21  ALKPHOS 90 86  BILITOT 3.0* 1.8*  PROT 6.0* 5.2*  ALBUMIN 1.9* 1.6*   No results for input(s): LIPASE, AMYLASE in the last 168 hours. No results for input(s): AMMONIA in the last 168 hours. CBC: Recent Labs  Lab 07/14/18 0239 07/14/18 1049 07/14/18 1645 07/14/18 2037 07/15/18 0342 07/16/18 0604  WBC 15.5* 13.8* 13.6* 13.1* 11.9* 10.0  NEUTROABS 13.3*  --   --   --   --   --   HGB 9.8* 10.2* 9.0* 9.2* 8.9* 9.0*  HCT 29.3* 30.9* 27.2* 27.5* 27.1* 28.5*  MCV 88.8 89.0 89.5 89.6 89.4 92.8  PLT 628* 677* 609* 608* 613* 675*   Cardiac Enzymes: No results for input(s): CKTOTAL, CKMB, CKMBINDEX, TROPONINI in the last 168 hours. BNP: Invalid input(s): POCBNP CBG: Recent Labs  Lab  07/16/18 1210 07/16/18 1642 07/16/18 2120 07/18/18 0813 07/18/18 1211  GLUCAP 119* 107* 129* 100* 148*   D-Dimer No results for input(s): DDIMER in the last 72 hours. Hgb A1c No results for input(s): HGBA1C in the last 72 hours. Lipid Profile No results for input(s): CHOL, HDL, LDLCALC, TRIG, CHOLHDL, LDLDIRECT in the last 72 hours. Thyroid function studies No results for input(s): TSH, T4TOTAL, T3FREE, THYROIDAB in the last 72 hours.  Invalid input(s): FREET3 Anemia work up No results for input(s): VITAMINB12, FOLATE, FERRITIN, TIBC, IRON, RETICCTPCT in the last 72 hours. Urinalysis    Component Value Date/Time   COLORURINE RED (A) 07/14/2018 0244   APPEARANCEUR TURBID (A) 07/14/2018 0244   LABSPEC 1.015 07/14/2018 0244   PHURINE 6.5 07/14/2018 0244   GLUCOSEU >=500 (A) 07/14/2018 0244  HGBUR LARGE (A) 07/14/2018 0244   BILIRUBINUR MODERATE (A) 07/14/2018 0244   KETONESUR 15 (A) 07/14/2018 0244   PROTEINUR >300 (A) 07/14/2018 0244   NITRITE POSITIVE (A) 07/14/2018 0244   LEUKOCYTESUR MODERATE (A) 07/14/2018 0244    Microbiology Recent Results (from the past 240 hour(s))  MRSA PCR Screening     Status: None   Collection Time: 07/08/18  6:10 PM  Result Value Ref Range Status   MRSA by PCR NEGATIVE NEGATIVE Final    Comment:        The GeneXpert MRSA Assay (FDA approved for NASAL specimens only), is one component of a comprehensive MRSA colonization surveillance program. It is not intended to diagnose MRSA infection nor to guide or monitor treatment for MRSA infections. Performed at [facility], [address], [place], [place] [address]   Culture, blood (Routine x 2)     Status: None (Preliminary result)   Collection Time: 07/14/18  2:39 AM  Result Value Ref Range Status   Specimen Description BLOOD RIGHT ARM  Final   Special Requests   Final    BOTTLES DRAWN AEROBIC ONLY Blood Culture results may not be optimal due to an inadequate volume of blood  received in culture bottles   Culture   Final    NO GROWTH 4 DAYS Performed at [facility], [address], [place], [place] [address]    Report Status PENDING  Incomplete  Urine culture     Status: None   Collection Time: 07/14/18  2:39 AM  Result Value Ref Range Status   Specimen Description URINE, RANDOM  Final   Special Requests NONE  Final   Culture   Final    NO GROWTH Performed at [facility], [address], [place], [place] [address]    Report Status 07/15/2018 FINAL  Final  Culture, blood (Routine x 2)     Status: None (Preliminary result)   Collection Time: 07/14/18  2:41 AM  Result Value Ref Range Status   Specimen Description BLOOD RIGHT UPPER ARM  Final   Special Requests   Final    BOTTLES DRAWN AEROBIC AND ANAEROBIC Blood Culture adequate volume   Culture   Final    NO GROWTH 4 DAYS Performed at [facility], [address], [place], [place] [address]    Report Status PENDING  Incomplete    Time coordinating discharge: Approximately 40 minutes  Tyrone Nine, MD  [facility] [place], 12:45 PM Pager [phone]

## 2018-07-18 NOTE — Care Management Important Message (Signed)
Important Message  Patient Details  Name: Eric MorseRobert R Laakso MRN: [id number] Date of Birth: [birth year]   Medicare Important Message Given:  Yes    Dorena BodoIris Elia Nunley 07/18/2018, 3:35 PM

## 2018-07-18 NOTE — Care Management Note (Signed)
Case Management Note  Patient Details  Name: Eric MorseRobert R Calderon MRN: [id number] Date of Birth: 1941/06/06  Subjective/Objective:      Admitted with acute urinary retention/UTI, hx of  A. Fib / Coumadin, CAD status post CABG 4 2008, hyperlipidemia, hypertension, diabetes, COPD. From SNF.       Action/Plan: Transition back to SNF/ [facility] today. CSW managing disposition back to facility.   Expected Discharge Date:  07/18/18               Expected Discharge Plan:     In-House Referral:     Discharge planning Services     Post Acute Care Choice:    Choice offered to:      Status of Service:    completed If discussed at Long Length of Stay Meetings, dates discussed:    Additional Comments:  [name], [name], RN 07/18/2018, 1:23 PM

## 2018-07-18 NOTE — Progress Notes (Signed)
Called report to [facility]. Pt's IVs have been removed and he is ready for [facility] to be picked up.

## 2018-07-18 NOTE — Progress Notes (Signed)
Patient will DC to: [facility] Anticipated DC date: 07/18/18 Family notified: Patient notifying his son Transport by: [facility] 2:30pm   Per MD patient ready for [facility] to [facility]. RN, patient, patient's family, and facility notified of DC. Discharge Summary sent to facility. RN given number for report [phone]). DC packet on chart. Ambulance transport requested for patient.   CSW signing off.  Cristobal GoldmannNadia Jaja Switalski, LCSW Clinical Social Worker [phone]'

## 2018-07-19 LAB — CULTURE, BLOOD (ROUTINE X 2)
Culture: NO GROWTH
Culture: NO GROWTH
Special Requests: ADEQUATE

## 2018-07-21 ENCOUNTER — Encounter: Payer: Self-pay | Admitting: Internal Medicine

## 2018-07-21 ENCOUNTER — Non-Acute Institutional Stay (SKILLED_NURSING_FACILITY): Payer: Medicare Other | Admitting: Internal Medicine

## 2018-07-21 DIAGNOSIS — K921 Melena: Secondary | ICD-10-CM | POA: Diagnosis not present

## 2018-07-21 DIAGNOSIS — R310 Gross hematuria: Secondary | ICD-10-CM | POA: Diagnosis not present

## 2018-07-21 DIAGNOSIS — E43 Unspecified severe protein-calorie malnutrition: Secondary | ICD-10-CM

## 2018-07-21 DIAGNOSIS — I482 Chronic atrial fibrillation, unspecified: Secondary | ICD-10-CM

## 2018-07-21 DIAGNOSIS — R296 Repeated falls: Secondary | ICD-10-CM | POA: Insufficient documentation

## 2018-07-21 LAB — CBC AND DIFFERENTIAL
HEMATOCRIT: 36 — AB (ref 41–53)
HEMOGLOBIN: 11.8 — AB (ref 13.5–17.5)
Neutrophils Absolute: 9
Platelets: 858 — AB (ref 150–399)
WBC: 10.2

## 2018-07-21 LAB — BASIC METABOLIC PANEL
BUN: 20 (ref 4–21)
CREATININE: 1.2 (ref 0.6–1.3)
Glucose: 337
POTASSIUM: 3.4 (ref 3.4–5.3)
SODIUM: 130 — AB (ref 137–147)

## 2018-07-21 NOTE — Patient Instructions (Signed)
See assessment and plan under each diagnosis in the problem list and acutely for this visit 

## 2018-07-21 NOTE — Assessment & Plan Note (Addendum)
GI will discuss colonoscopy with the patient at follow-up He must avoid the "aspirin family" including aspirin and the nonsteroidal agents such as indomethacin, ibuprofen, &  Naproxen especially in context of anticoagulation

## 2018-07-21 NOTE — Progress Notes (Signed)
NURSING HOME LOCATION:  [facility] ROOM NUMBER:  120-A  CODE STATUS:  Full Code  PCP:  Marva PandaMillsaps, Kimberly, NP  [facility] [address] [place] [id number]  This is a comprehensive admission note to [facility] performed on this date less than 30 days from date of admission. Included are preadmission medical/surgical history; reconciled medication list; family history; social history and comprehensive review of systems.  Corrections and additions to the records were documented. Comprehensive physical exam was also performed. Additionally a clinical summary was entered for each active diagnosis pertinent to this admission in the Problem List to enhance continuity of care.  HPI: Patient was rehospitalized 7/22-7/26/19 with urinary retention and gross hematuria (>50 RBC/HPF; large Hgb on dipstick). Hematuria did clear after placement of Foley and IV fluids. Cystitis/UTI was suspect; but final culture revealed no growth.. He was discharged to SNF for PT/OT because of history of recurrent falls. At discharge he was on Eliquis and indomethacin.  The patient was originally hospitalized 7/16-7/19/19 with symptomatic anemia. The patient had recurrent falls in the context of progressive anorexia and dizziness. CT of the head revealed no acute process. Imaging revealed left eighth and ninth fractured ribs which were nondisplaced. He also noted tarry stools. At admission his INR was over 10. This was in the context of warfarin and indomethacin regimen. INR was 1.37 following  fresh frozen plasma and vitamin K administration. Hgb was 7.9 at admission. He received 2 units of packed red cells on 7/16 and  fresh frozen plasma. Hemoglobin remained stable in the eights.  Dr Ewing SchleinMagod performed EGD 7/17 which revealed some blood clots in the stomach and a short segment of Barrett's esophagus.Warfarin was discontinued and he was transitioned to Eliquis as per GI  recommendations. Colonoscopy was declined by the patient Creatinine was found to be 2.62, baseline was 1.64. BUN was 99. Troponin elevation was attributed to demand ischemia in the context of the A. fib with rapid ventricular response and severe anemia. He had no chest pain associated with  troponin elevation. He did exhibit A. fib with rapid ventricular response. Rate was controlled. Hypotension limited medication titration. Sliding-scale insulin was administered for type 2 diabetes.  Past medical and surgical history: Includes CAD status post PCI followed by CABG. He also has COPD with greater than 100-pack-year smoking history.   Social history: Smoking history delineated above. Minimal alcohol intake, 2 beers per month.  Family history: Reviewed   Review of systems:  Today I reviewed his pre 7/16 admission history. He states that over a 10 day, he did fall 15 times. He stated that he would get up and he would simply drop to floor. He denied any cardiac or neurologic prodrome prior to the falls. Falls occured within 2 steps of ambulating. During that 10 day he states he had severe anorexia with essentially no intake. He states that he had been taking Indocin 1-2 pills approximately every 30 days. He's had gout for 30+ years but it has been quiescent in the recent past. He does describe some depression. He has pain in the left thorax with change of position. Constitutional: No fever Eyes: No redness, discharge, pain, vision change ENT/mouth: No nasal congestion, purulent discharge, earache, change in hearing, sore throat  Cardiovascular: No palpitations, paroxysmal nocturnal dyspnea, claudication, edema  Respiratory: No cough, sputum production, hemoptysis, DOE, significant snoring, apnea  Gastrointestinal: No heartburn, dysphagia, abdominal pain, nausea /vomiting, rectal bleeding, change in bowels Genitourinary: No dysuria, pyuria,  incontinence, nocturia Musculoskeletal: No joint stiffness,  joint swelling Dermatologic: No rash, pruritus, change in appearance of skin Neurologic: No dizziness, headache, syncope, seizures, numbness, tingling Psychiatric: No significant anxiety, insomnia, anorexia Endocrine: No change in hair/skin/nails, excessive thirst, excessive hunger, excessive urination  Hematologic/lymphatic: No significant bruising, lymphadenopathy Allergy/immunology: No itchy/watery eyes, significant sneezing, urticaria, angioedema  Physical exam:  Pertinent or positive findings: He is alert and oriented and gives an excellent history. He is thin and appears suboptimally nourished. Hair is disheveled. He is Transport planner. He has complete dentures. Breath sounds are decreased. Heart sounds are distant and slow and irregular. Pedal pulses are decreased. He has clubbing of the nailbeds. There is dramatic atrophy of the limbs, particularly the gastrocnemius muscles. The third left finger is wrapped. By history he has a laceration on this finger sustained in a fall. There is extensive resolving bruising over the right lateral thigh.  General appearance: no acute distress, increased work of breathing is present.   Lymphatic: No lymphadenopathy about the head, neck, axilla. Eyes: No conjunctival inflammation or lid edema is present. There is no scleral icterus. Ears:  External ear exam shows no significant lesions or deformities.   Nose:  External nasal examination shows no deformity or inflammation. Nasal mucosa are pink and moist without lesions, exudates Oral exam: Lips and gums are healthy appearing.There is no oropharyngeal erythema or exudate. Neck:  No thyromegaly, masses, tenderness noted.    Heart:  No gallop, murmur, click, rub.  Lungs: without wheezes, rhonchi, rales, rubs. Abdomen: Bowel sounds are normal.  Abdomen is soft and nontender with no organomegaly, hernias, masses. GU: Deferred  Extremities:  No cyanosis, edema. Neurologic exam:  Balance, Rhomberg, finger to nose  testing could not be completed due to clinical state Deep tendon reflexes are equal Skin: Warm & dry w/o tenting. No significant  rash.  See clinical summary under each active problem in the Problem List with associated updated therapeutic plan  Appendum: I was asked to see him because of blood pressure systolic of 50. He was in the wheelchair without complaints of chest pain; but he did describe some shortness of breath. He also stated he was lightheaded. He stated that he gone to the bathroom with help and  had a "brown" bowel movement. He denied melena or rectal bleeding. He exhibited A. fib. The peripheral pulses were difficult to palpate. Blood pressure was 70/40 bilaterally. He was oriented 3. Despite the A. fib, his beta blocker will be held. CBC will be rechecked to rule out progressive occult bleeding.

## 2018-07-21 NOTE — Assessment & Plan Note (Addendum)
Continue Eliquis Hold Beta blocker while hypotensive

## 2018-07-21 NOTE — Assessment & Plan Note (Addendum)
Nutrition consultation Ensure supplements

## 2018-07-21 NOTE — Assessment & Plan Note (Signed)
Check sitting and standing blood pressure  PT/OT Perform isometric exercises before standing. Sit on side of the bed and raise up on toes to a count of 5. Then put pressure on the heels to a count of 5. Repeat this process 5 times. This will help prevent postural hypotension

## 2018-07-21 NOTE — Assessment & Plan Note (Signed)
Discontinue indomethacin because its antiplatelet effects

## 2018-07-22 ENCOUNTER — Non-Acute Institutional Stay (SKILLED_NURSING_FACILITY): Payer: Medicare Other | Admitting: Internal Medicine

## 2018-07-22 ENCOUNTER — Encounter: Payer: Self-pay | Admitting: Internal Medicine

## 2018-07-22 ENCOUNTER — Other Ambulatory Visit: Payer: Self-pay | Admitting: Internal Medicine

## 2018-07-22 DIAGNOSIS — I10 Essential (primary) hypertension: Secondary | ICD-10-CM

## 2018-07-22 DIAGNOSIS — E1151 Type 2 diabetes mellitus with diabetic peripheral angiopathy without gangrene: Secondary | ICD-10-CM | POA: Diagnosis not present

## 2018-07-22 DIAGNOSIS — Z8739 Personal history of other diseases of the musculoskeletal system and connective tissue: Secondary | ICD-10-CM

## 2018-07-22 DIAGNOSIS — K921 Melena: Secondary | ICD-10-CM | POA: Diagnosis not present

## 2018-07-22 DIAGNOSIS — Z9189 Other specified personal risk factors, not elsewhere classified: Secondary | ICD-10-CM

## 2018-07-22 NOTE — Assessment & Plan Note (Signed)
07/21/18 hemoglobin has risen from 9 up to 11.8. Anemia is normochromic normocytic. RDW 16.1, minimally elevated. No melena reported.

## 2018-07-22 NOTE — Assessment & Plan Note (Signed)
07/21/18 uric acid 3.3; creatinine 1.24 D/C Allopurinol

## 2018-07-22 NOTE — Assessment & Plan Note (Signed)
07/22/18 random glucose 337; otherwise glucose range 120-134 Glipizide D/Ced because of risk of hypoglycemia.  Basal insulin will be adjusted based on fasting blood glucose

## 2018-07-22 NOTE — Progress Notes (Signed)
NURSING HOME LOCATION:  [facility] ROOM NUMBER:  120-A  CODE STATUS:  Full Code  PCP:  Marva PandaMillsaps, Kimberly, NP  [facility] [address] [place] [id number]  This is a nursing facility follow up for specific acute issues of hypotension and hyperglycemia.  Interim medical record and care since [facility] was updated with review of diagnostic studies and change in clinical status since last visit were documented.  HPI: Please see H&P 07/21/18. I was called back to see the patient because of a systolic blood pressure of 50. I documented the blood pressure to be 70/40 bilaterally. Despite the profound hypotension, he was mentating and had no symptoms except for some shortness of breath and some dizziness. Specifically denied any chest pain. Metoprolol was held and 1 L of normal saline initiated at 50 mL an hour. Blood pressure has improved. The blood pressure range has been from 96/66 to a high of 138/76.  He was originally admitted 7/16 for an acute GI bleed, the etiology has not been defined. He also had gross hematuria with no evidence of UTI. The initial GI bleed was in the context of supranormal INR while on warfarin as well as as needed indomethacin for gout. The second  bleed as hematuria  was in the context of Eliquis and indomethacin. Indomethacin has been held and marked as contraindicated. Hemoglobin has risen from 9-11.8 as of 7/29. On 7/29 the patient reported brown stool without melena. He has been on basal insulin at bedtime if glucoses over 200. He is also on combined oral agent Janumet and glipizide. Random glucose yesterday afternoon was 337. Other than that isolated high; glucoses have run 120-134. A1c been 7.1%. Creatinine is 1.24. Possible discordance related to the recent history of AKI was suspect.  Review of systems: He has some lightheadedness and slight shortness of breath but symptoms are much improved compared to  yesterday. Constitutional: No fever, significant weight change  Cardiovascular: No chest pain, palpitations, paroxysmal nocturnal dyspnea, claudication, edema  Respiratory: No cough, sputum production, hemoptysis,   Gastrointestinal: No heartburn, dysphagia, abdominal pain, nausea /vomiting, rectal bleeding, melena, change in bowels Genitourinary: No dysuria, hematuria, pyuria, incontinence, nocturia Musculoskeletal: No joint stiffness, joint swelling, weakness, pain Dermatologic: No rash, pruritus, change in appearance of skin Neurologic: No  headache, syncope, seizures, numbness, tingling Psychiatric: No significant anxiety, depression, insomnia, anorexia Endocrine: No change in hair/skin/nails, excessive thirst, excessive hunger, excessive urination  Hematologic/lymphatic: No significant bruising, lymphadenopathy, abnormal bleeding Allergy/immunology: No itchy/watery eyes, significant sneezing, urticaria, angioedema  Physical exam:  Pertinent or positive findings: Hair is disheveled. He is sitting upright in the wheelchair receiving IV fluids. He is alert and oriented. There is a subcutaneous cyst of left cheek. Breath sounds are decreased. Heart rate is irregular; heart sounds are distant. Pedal pulses are decreased especially posterior tibial pulses. Again the most striking finding are the atrophic changes of the limbs, especially the lower legs. The third left finger is dressed. He has scattered bruising, especially of the upper extremities.  General appearance:  no acute distress, increased work of breathing is present.   Lymphatic: No lymphadenopathy about the head, neck, axilla. Eyes: No conjunctival inflammation or lid edema is present. There is no scleral icterus. Ears:  External ear exam shows no significant lesions or deformities.   Nose:  External nasal examination shows no deformity or inflammation. Nasal mucosa are pink and moist without lesions, exudates Oral exam:  Lips and  gums  are healthy appearing. Neck:  No thyromegaly, masses, tenderness noted.    Heart:  No gallop, murmur, click, rub .  Lungs:  without wheezes, rhonchi, rales, rubs. Abdomen: Bowel sounds are normal. Abdomen is soft and nontender with no organomegaly, hernias, masses. GU: Deferred  Extremities:  No cyanosis, clubbing, edema  Neurologic exam : Balance, Rhomberg, finger to nose testing could not be completed due to clinical state Skin: Warm & dry w/o tenting. No significant  rash.  See summary under each active problem in the Problem List with associated updated therapeutic plan

## 2018-07-22 NOTE — Patient Instructions (Signed)
See assessment and plan under each diagnosis in the problem list and acutely for this visit 

## 2018-07-22 NOTE — Assessment & Plan Note (Addendum)
07/21/18 systolic blood pressure reported at 50, recheck 70/40 bilaterally with minimal symptoms of shortness of breath and dizziness 7/30 Blood pressure improved, metoprolol will be restarted because of A. fib  Blood pressure monitor

## 2018-07-22 NOTE — Assessment & Plan Note (Signed)
Hold fenofibrate, PCP can readdress after discharge from the SNF

## 2018-07-24 ENCOUNTER — Encounter: Payer: Self-pay | Admitting: Internal Medicine

## 2018-07-24 DIAGNOSIS — I951 Orthostatic hypotension: Secondary | ICD-10-CM | POA: Insufficient documentation

## 2018-07-24 LAB — BASIC METABOLIC PANEL
BUN: 25 — AB (ref 4–21)
Creatinine: 0.9 (ref 0.6–1.3)
GLUCOSE: 117
Potassium: 4 (ref 3.4–5.3)
Sodium: 135 — AB (ref 137–147)

## 2018-07-24 LAB — CBC AND DIFFERENTIAL
HEMATOCRIT: 33 — AB (ref 41–53)
HEMOGLOBIN: 10.9 — AB (ref 13.5–17.5)
Platelets: 638 — AB (ref 150–399)
WBC: 7.7

## 2018-07-28 ENCOUNTER — Non-Acute Institutional Stay (SKILLED_NURSING_FACILITY): Payer: Medicare Other | Admitting: Adult Health

## 2018-07-28 ENCOUNTER — Encounter: Payer: Self-pay | Admitting: Adult Health

## 2018-07-28 DIAGNOSIS — N390 Urinary tract infection, site not specified: Secondary | ICD-10-CM

## 2018-07-28 DIAGNOSIS — K5901 Slow transit constipation: Secondary | ICD-10-CM

## 2018-07-28 DIAGNOSIS — L03114 Cellulitis of left upper limb: Secondary | ICD-10-CM | POA: Diagnosis not present

## 2018-07-28 NOTE — Progress Notes (Signed)
Location:  [facility] Room Number: 120-A Place of Service:  SNF (31) Provider:  Kenard Gower, NP  Patient Care Team: Marva Panda, NP as PCP - General  Extended Emergency Contact Information Primary Emergency Contact: [facility] RIDING,  Mobile Phone: [phone] Relation: None  Code Status:  Full Code  Goals of care: Advanced Directive information Advanced Directives 07/14/2018  Does Patient Have a Medical Advance Directive? No  Type of Advance Directive Healthcare Power of Attorney  Does patient want to make changes to medical advance directive? No - Patient declined  Copy of Healthcare Power of Attorney in Chart? Yes  Would patient like information on creating a medical advance directive? No - Patient declined     Chief Complaint  Patient presents with  . Acute Visit    Patient with symptoms of UTI and constipation    HPI:  Pt is a 77 y.o. male seen today for an acute visit due to symptoms of UTI and complaints of constipation.  He is a short-term rehabilitation [facility].  He has a PMH of CAD, status post PCI/CABG, COPD, AKI, and diabetes. He complained of constipation, lower abdominal pain and urinary incontinence. He said that he is normally continent of urine.   Past Medical History:  Diagnosis Date  . Atrial fibrillation with rapid ventricular response (HCC)   . CAD S/P percutaneous coronary angioplasty October 2006   CATH: 80% MID LAD, involving diagonal bifurcation; 99%  AV GROOVE  CIRCUMFLEX --> PCI of circumflex with 3.0 mm 13 and a Cypher DES; planned staged LAD PCI  . CAD, multiple vessel November 2007   LAD lesion persists, 80% pre-D1 with 60% D1 and distal down the one lesion. 70% ISR and circumflex, L. PDA 80%; nondominant RCA - diffuse 80-70%  . Chronic atrial fibrillation (HCC)    Rate control with beta blocker, anticoagulated warfarin. This patients CHA2DS2-VASc  Score and unadjusted Ischemic Stroke Rate (% per year) is equal to 3.2 % stroke rate/year from a score of 3  . COPD (chronic obstructive pulmonary disease) (HCC)    Greater than 100 pack year smoking history, quit 2009  . Diabetes mellitus without complication (HCC)    2012  . Essential hypertension   . Exertional dyspnea Since 2006   a) 8/'11 Myoview: No Ischemic/Infarct; Echo: Nl LV size & fxn, EF 55-60%, mod-Severe LA & RA dilation,mild AoV sclerosis, Mild PHTN;; 4/'14 Echo: LV EF  55-60%, aortic sclerosis, moderate MR, moderate to severe LA and RA dilation, increased RV pressure -mild PHtn; 9/'15: Lexiscan Myovew: No Ischmia or Infarct, Afib - no EF.  . GI bleed   . Hyperlipidemia with target LDL less than 70   . Mild mitral regurgitation by prior echocardiography 03/2013   Echo 2019:  Mild MR  . Rib fracture   . 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 Supratherapeutic INR   . Symptomatic anemia    Past Surgical History:  Procedure Laterality Date  . CARDIAC CATHETERIZATION  2007   CONCLUSION SIGNIFICANT THREE VESSEL CAD  2.NORMAL LEFT VENTRICULAR SYSTOLIC FUNCTION  . CORONARY ARTERY BYPASS GRAFT  2008   x4  . ESOPHAGOGASTRODUODENOSCOPY (EGD) WITH PROPOFOL N/A 07/09/2018   Procedure: ESOPHAGOGASTRODUODENOSCOPY (EGD) WITH PROPOFOL;  Surgeon: [facility] Rigger, MD;  Location: [facility];  Service: Endoscopy;  Laterality: N/A;  . NM MYOVIEW LTD  September 2015   Non-gated. Low  risk. No ischemia or infarct  . TRANSTHORACIC ECHOCARDIOGRAM  04/02/2013   EF 55-60%. No regional W. May. Moderate MR, moderate to severely dilated LA. Mild RV pressure increase with moderate RA dilation. Moderate TR.  [name] TRANSTHORACIC ECHOCARDIOGRAM  05/2018   Normal LV size.  Moderate LVH.  EF 55-60% with no regional wall motion abnormality.  Unable to assess diastolic function.  Severe biatrial enlargement.  Mild MR    Allergies  Allergen Reactions  . Indomethacin Other (See  Comments)    GI bleed on NSAID & warfarin  . Niaspan [Niacin Er] Other (See Comments)    Reaction not noted, but on MAR at facility    Outpatient Encounter Medications as of 07/28/2018  Medication Sig  . acetaminophen (TYLENOL) 500 MG tablet Take 500 mg by mouth every 8 (eight) hours as needed for mild pain, fever or headache.   [name] apixaban (ELIQUIS) 5 MG TABS tablet Take 1 tablet (5 mg total) by mouth 2 (two) times daily.  . cephALEXin (KEFLEX) 500 MG capsule Take 1 capsule (500 mg total) by mouth 3 (three) times daily.  . furosemide (LASIX) 40 MG tablet TAKE 1 TABLET(40 MG) BY MOUTH DAILY  . insulin glargine (LANTUS) 100 UNIT/ML injection Inject 10 Units into the skin at bedtime as needed (for BGL 200 or greater).  . insulin glargine (LANTUS) 100 UNIT/ML injection Inject 5-20 Units into the skin See admin instructions. SSI:  150-200 = 5 units, 201-250 = 10 units, 251-300 = 15 units, 301-350 = 20 units, greater than 350 call provider  . metoprolol tartrate (LOPRESSOR) 25 MG tablet Take 0.5 tablets (12.5 mg total) by mouth 2 (two) times daily.  [name] NUTRITIONAL SUPPLEMENT LIQD Take 120 mLs by mouth daily. NAS MedPass  . potassium chloride (K-DUR,KLOR-CON) 10 MEQ tablet Take 1 tablet (10 mEq total) by mouth daily.  . predniSONE (DELTASONE) 10 MG tablet Take 10 mg by mouth 2 (two) times daily with a meal.  . simvastatin (ZOCOR) 40 MG tablet TAKE 1 TABLET(40 MG) BY MOUTH AT BEDTIME  . SitaGLIPtin-MetFORMIN HCl (JANUMET XR) 50-1000 MG TB24 Take 1 tablet by mouth daily.   No facility-administered encounter medications on file as of 07/28/2018.     Review of Systems  GENERAL: No change in appetite, no fatigue, no weight changes, no fever, chills or weakness MOUTH and THROAT: Denies oral discomfort, gingival pain or bleeding RESPIRATORY: no cough, SOB, DOE, wheezing, hemoptysis CARDIAC: No chest pain, edema or palpitations GI:+ lower abdominal pain, +constipation GU: + incontinence PSYCHIATRIC:  Denies feelings of depression or anxiety. No report of hallucinations, insomnia, paranoia, or agitation    Immunization History  Administered Date(s) Administered  . Tdap 07/08/2018   Pertinent  Health Maintenance Due  Topic Date Due  . FOOT EXAM  10/23/1951  . OPHTHALMOLOGY EXAM  10/23/1951  . URINE MICROALBUMIN  10/23/1951  . PNA vac Low Risk Adult (1 of 2 - PCV13) 10/22/2006  . INFLUENZA VACCINE  07/24/2018  . HEMOGLOBIN A1C  01/08/2019      Vitals:   07/28/18 1426  BP: 105/63  Pulse: 94  Resp: 16  Temp: (!) 96.9 F (36.1 C)  TempSrc: Oral  SpO2: 98%  Weight: 141 lb 1.6 oz (64 kg)  Height: 5\' 8"  (1.727 m)   Body mass index is 21.45 kg/m.  Physical Exam  GENERAL APPEARANCE: Well nourished. In no acute distress. Normal body habitus SKIN:  Skin is warm and dry. No erythema on left elbow MOUTH and THROAT: Lips  are without lesions. Oral mucosa is moist and without lesions. Tongue is normal in shape, size, and color and without lesions RESPIRATORY: Breathing is even & unlabored, BS CTAB CARDIAC: RRR, no murmur,no extra heart sounds, no edema GI: normal BS, no masses EXTREMITIES:  Able to move X 4 extremities, has saline lock on left wrist PSYCHIATRIC: Alert and oriented X 3. Affect and behavior are appropriate   Labs reviewed: Recent Labs    07/08/18 0729  07/10/18 0522 07/11/18 0445  07/14/18 0239 07/15/18 0342 07/16/18 0604 07/21/18 07/24/18  NA 131*   < > 139 135   < > 128* 131* 133* 130* 135*  K 4.0   < > 3.3* 3.5   < > 3.1* 3.7 4.1 3.4 4.0  CL 96*   < > 111 105   < > 95* 103 105  --   --   CO2 12*   < > 21* 23   < > 24 23 23   --   --   GLUCOSE 297*   < > 84 91   < > 177* 123* 143*  --   --   BUN 101*   < > 41* 26*   < > 16 15 14 20  25*  CREATININE 3.02*   < > 1.29* 1.20   < > 1.15 0.97 0.96 1.2 0.9  CALCIUM 8.3*   < > 7.7* 7.8*   < > 7.6* 7.3* 7.4*  --   --   MG 2.5*  --  1.7 1.9  --   --   --   --   --   --    < > = values in this interval not  displayed.   Recent Labs    07/08/18 1031 07/14/18 0239 07/16/18 0604  AST 50* 70* 36  ALT 20 34 21  ALKPHOS 36* 90 86  BILITOT 1.2 3.0* 1.8*  PROT 4.7* 6.0* 5.2*  ALBUMIN 1.9* 1.9* 1.6*   Recent Labs    07/14/18 0239  07/14/18 2037 07/15/18 0342 07/16/18 0604 07/21/18 07/24/18  WBC 15.5*   < > 13.1* 11.9* 10.0 10.2 7.7  NEUTROABS 13.3*  --   --   --   --  9  --   HGB 9.8*   < > 9.2* 8.9* 9.0* 11.8* 10.9*  HCT 29.3*   < > 27.5* 27.1* 28.5* 36* 33*  MCV 88.8   < > 89.6 89.4 92.8  --   --   PLT 628*   < > 608* 613* 675* 858* 638*   < > = values in this interval not displayed.   Lab Results  Component Value Date   TSH 3.235 07/08/2018   Lab Results  Component Value Date   HGBA1C 7.4 (H) 07/08/2018    Significant Diagnostic Results in last 30 days:  Dg Chest 2 View  Result Date: 07/14/2018 CLINICAL DATA:  Fever. EXAM: CHEST - 2 VIEW COMPARISON:  07/08/2018 FINDINGS: Postoperative changes in the mediastinum. Heart size and pulmonary vascularity are normal. Small bilateral pleural effusions, greater on the left. Basilar atelectasis. No focal consolidation. No pneumothorax. Mediastinal contours appear intact. IMPRESSION: Small bilateral pleural effusions and basilar atelectasis, greater on the left. Electronically Signed   By: Burman Nieves M.D.   On: 07/14/2018 02:16   Dg Ribs Unilateral W/chest Left  Result Date: 07/08/2018 CLINICAL DATA:  Multiple falls over the last week, left lower rib pain EXAM: LEFT RIBS AND CHEST - 3+ VIEW COMPARISON:  Chest x-ray of 02/23/2014  FINDINGS: No active infiltrate or effusion is seen. The lungs are slightly hyperaerated. Mediastinal and hilar contours are unremarkable. Median sternotomy sutures are noted from prior CABG. The heart size is stable. Left rib detail films show nondisplaced fractures of the very anterior left eighth and ninth ribs. IMPRESSION: 1. Nondisplaced fractures of the anterior left eighth and ninth ribs. 2. No active lung  disease. Electronically Signed   By: Dwyane DeePaul  Barry M.D.   On: 07/08/2018 08:37   Ct Head Wo Contrast  Result Date: 07/08/2018 CLINICAL DATA:  Fall, headache, neck pain EXAM: CT HEAD WITHOUT CONTRAST CT CERVICAL SPINE WITHOUT CONTRAST TECHNIQUE: Multidetector CT imaging of the head and cervical spine was performed following the standard protocol without intravenous contrast. Multiplanar CT image reconstructions of the cervical spine were also generated. COMPARISON:  None. FINDINGS: CT HEAD FINDINGS Brain: Diffuse cerebral atrophy. No acute intracranial abnormality. Specifically, no hemorrhage, hydrocephalus, mass lesion, acute infarction, or significant intracranial injury. Vascular: No hyperdense vessel or unexpected calcification. Skull: No acute calvarial abnormality. Sinuses/Orbits: Visualized paranasal sinuses and mastoids clear. Orbital soft tissues unremarkable. Other: None CT CERVICAL SPINE FINDINGS Alignment: No subluxation Skull base and vertebrae: No acute fracture. No primary bone lesion or focal pathologic process. Soft tissues and spinal canal: No prevertebral fluid or swelling. No visible canal hematoma. Disc levels: Disc space narrowing and spurring from C4-5 through C6-7. Upper chest: Biapical scarring Other: No acute findings.  Carotid artery calcifications. IMPRESSION: Diffuse cerebral atrophy.  No acute intracranial abnormality. 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 No acute bony abnormality. Electronically Signed   By: Charlett NoseKevin  Dover M.D.   On: 07/08/2018 08:11   Ct Cervical Spine Wo Contrast  Result Date: 07/08/2018 CLINICAL DATA:  Fall, headache, neck pain EXAM: CT HEAD WITHOUT CONTRAST CT CERVICAL SPINE WITHOUT CONTRAST TECHNIQUE: Multidetector CT imaging of the head and cervical spine was performed following the standard protocol without intravenous contrast. Multiplanar CT image reconstructions of the cervical spine were also generated. COMPARISON:  None.  FINDINGS: CT HEAD FINDINGS Brain: Diffuse cerebral atrophy. No acute intracranial abnormality. Specifically, no hemorrhage, hydrocephalus, mass lesion, acute infarction, or significant intracranial injury. Vascular: No hyperdense vessel or unexpected calcification. Skull: No acute calvarial abnormality. Sinuses/Orbits: Visualized paranasal sinuses and mastoids clear. Orbital soft tissues unremarkable. Other: None CT CERVICAL SPINE FINDINGS Alignment: No subluxation Skull base and vertebrae: No acute fracture. No primary bone lesion or focal pathologic process. Soft tissues and spinal canal: No prevertebral fluid or swelling. No visible canal hematoma. Disc levels: Disc space narrowing and spurring from C4-5 through C6-7. Upper chest: Biapical scarring Other: No acute findings.  Carotid artery calcifications. IMPRESSION: Diffuse cerebral atrophy.  No acute intracranial abnormality. 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 No acute bony abnormality. Electronically Signed   By: Charlett NoseKevin  Dover M.D.   On: 07/08/2018 08:11   Dg Knee 4 Views W/patella Left  Result Date: 07/16/2018 CLINICAL DATA:  Two week history of LEFT knee pain. Multiple recent falls. Initial encounter. EXAM: LEFT KNEE - COMPLETE 4+ VIEW COMPARISON:  None. FINDINGS: No evidence of acute or subacute fracture or dislocation. Mild tricompartment joint space narrowing. Chondrocalcinosis involving the LATERAL meniscus and to a lesser degree the MEDIAL meniscus. No visible joint effusion. Dystrophic calcifications involving the patellar tendon near its insertion on the patella. No evidence of a tracking abnormality on the patellar sunrise view. Femoropopliteal and tibioperoneal artery atherosclerosis. IMPRESSION: 1. No acute or subacute osseous abnormality. 2. Mild tricompartment  osteoarthritis. 3. CPPD with chondrocalcinosis involving the menisci. 4. Dystrophic calcifications involving the patellar tendon near its insertion on the  patella, possibly related to prior injury/tear. Electronically Signed   By: Hulan Saas M.D.   On: 07/16/2018 17:17    Assessment/Plan  1. Urinary tract infection without hematuria, site unspecified - has lower abdominal and urinary incontinence, will get urinalysis with culture and sensitivity, and start Cipro 500 mg BID and Florastor 250 mg BID X 8 days   2. Slow transit constipation - start Senna-S 8.6-50 mg 2 tabs BID and Miralax 17 gm PO BID X 5 days the Q D   3. Cellulitis of left upper extremity - left elbow has no erythema, has been on Keflex X 10 days, will discontinue Keflex and peripheral saline lock    Family/ staff Communication:  Discussed plan of care with the resident.  Labs/tests ordered:  Urinalysis with culture and sensitivity  Goals of care:   Short-term rehabilitation.   Kenard Gower, NP [facility] [phone] (Monday-Friday 8:00 a.m. - 5:00 p.m.) [phone] (after hours)

## 2018-07-31 ENCOUNTER — Encounter: Payer: Self-pay | Admitting: Internal Medicine

## 2018-07-31 ENCOUNTER — Non-Acute Institutional Stay (SKILLED_NURSING_FACILITY): Payer: Medicare Other | Admitting: Internal Medicine

## 2018-07-31 DIAGNOSIS — I951 Orthostatic hypotension: Secondary | ICD-10-CM

## 2018-07-31 DIAGNOSIS — R32 Unspecified urinary incontinence: Secondary | ICD-10-CM | POA: Insufficient documentation

## 2018-07-31 NOTE — Assessment & Plan Note (Signed)
Urology consultation 

## 2018-07-31 NOTE — Progress Notes (Signed)
    NURSING HOME LOCATION:  [facility] ROOM NUMBER:  120-A  CODE STATUS:  Full Code  PCP:  Marva PandaMillsaps, Kimberly, NP  [facility] [address] [place] [id number]  This is a nursing facility follow up for specific acute issue of hypotension.  Interim medical record and care since last [facility] visit was updated with review of diagnostic studies and change in clinical status since last visit were documented.  HPI: The patient continues to have dizziness upon sitting up with documented drop in blood pressure.  Nursing staff has documented improved BP since metoprolol was decreased. PT/OT reports that blood pressure was 113/62 supine and 87/46 sitting yesterday with slight dizziness.  There was no increase in pulse rate with drop in blood pressure.   [place] questioned using a drug to increase his blood pressure based on internet search of low blood pressure. I had ordered TED hose but these are not present on this exam. Also he had been instructed in isometric exercises.I reviewed his technique and it is suboptimal.  He was reinstructed as to proper technique. He now has had urinary incontinence over the last week.  Urine culture reveals 50,000 colonies of non-lactose fermenting gram-negative rods.During his recent hospitalization he had acute urinary retention necessitating a Foley catheter. Renal function has improved, most recent creatinine was 0.9 and BUN 25. Glucose has varied from a low of 132 fasting to a high of 366 after the evening meal. A1c was 7.4%. There has been a slight progression in his anemia. Hemoglobin dropped from 11.8-10.9 & hematocrit dropped from 36-33.  He denies any active bleeding dyscrasias. He remains on Eliquis He states that his constipation is resolved.  Also states that his appetite is significantly improved  Review of systems:  Cardiovascular: No chest pain, palpitations, paroxysmal  nocturnal dyspnea, claudication, edema  Respiratory: No cough, sputum production, hemoptysis, DOE, significant snoring, apnea   Gastrointestinal: No heartburn, dysphagia, abdominal pain, nausea /vomiting, rectal bleeding, melena Genitourinary: No dysuria, hematuria, pyuria Musculoskeletal: No joint stiffness, joint swelling, weakness, pain Endocrine: No change in hair/skin/nails, excessive thirst, excessive hunger, excessive urination  Hematologic/lymphatic: No significant bruising, lymphadenopathy, abnormal bleeding  Physical exam:  Pertinent or positive findings: Clinically he is alert and in no distress.  Compared to my 2 prior visits he does look healthier. Hair is disheveled. Rhythm slightly irregular.Decreased breath sounds. Limbs are thin but strength is improved. Very faint ecchymoses RLE.  General appearance:  no acute distress, increased work of breathing is present.   Lymphatic: No lymphadenopathy about the head, neck, axilla. Eyes: No conjunctival inflammation or lid edema is present. There is no scleral icterus. Oral exam:  Lips and gums are healthy appearing. There is no oropharyngeal erythema or exudate. Neck:  No thyromegaly, masses, tenderness noted.    Heart:  No gallop, murmur, click, rub .  Lungs: without wheezes, rhonchi, rales, rubs. Abdomen: Bowel sounds are normal. Abdomen is soft and nontender with no organomegaly, hernias, masses. GU: Deferred  Extremities:  No cyanosis,  edema  Skin: Warm & dry w/o tenting. No significant lesions or rash.  See summary under each active problem in the Problem List with associated updated therapeutic plan

## 2018-07-31 NOTE — Assessment & Plan Note (Addendum)
REorder TED hose Proper technique for isometric exercises of the lower limbs reviewed Up to Date consulted concerning pharmacotherapy of postural hypotension. Based on this review I feel Midrodrine and fludrocortisone potential adverse effects outweigh any potential benefit

## 2018-08-01 ENCOUNTER — Encounter: Payer: Self-pay | Admitting: Internal Medicine

## 2018-08-01 NOTE — Patient Instructions (Signed)
See assessment and plan under each diagnosis in the problem list and acutely for this visit 

## 2018-08-06 NOTE — Progress Notes (Signed)
The patient was to [facility] to [facility] [place] via family.  The nurse was unaware that the patient needed to be seen and the family exited the facility with patient prior to NP arrival in the facility.  An FL2 had been completed by [facility].  No scripts were given.

## 2019-06-24 IMAGING — CR DG KNEE COMPLETE 4+V*L*
5 series · 5 of 5 positions shown · non-contrast
Comparison: None.

CLINICAL DATA: Two week history of LEFT knee pain. Multiple recent
falls. Initial encounter.

EXAM:
LEFT KNEE - COMPLETE 4+ VIEW

[knee ap]
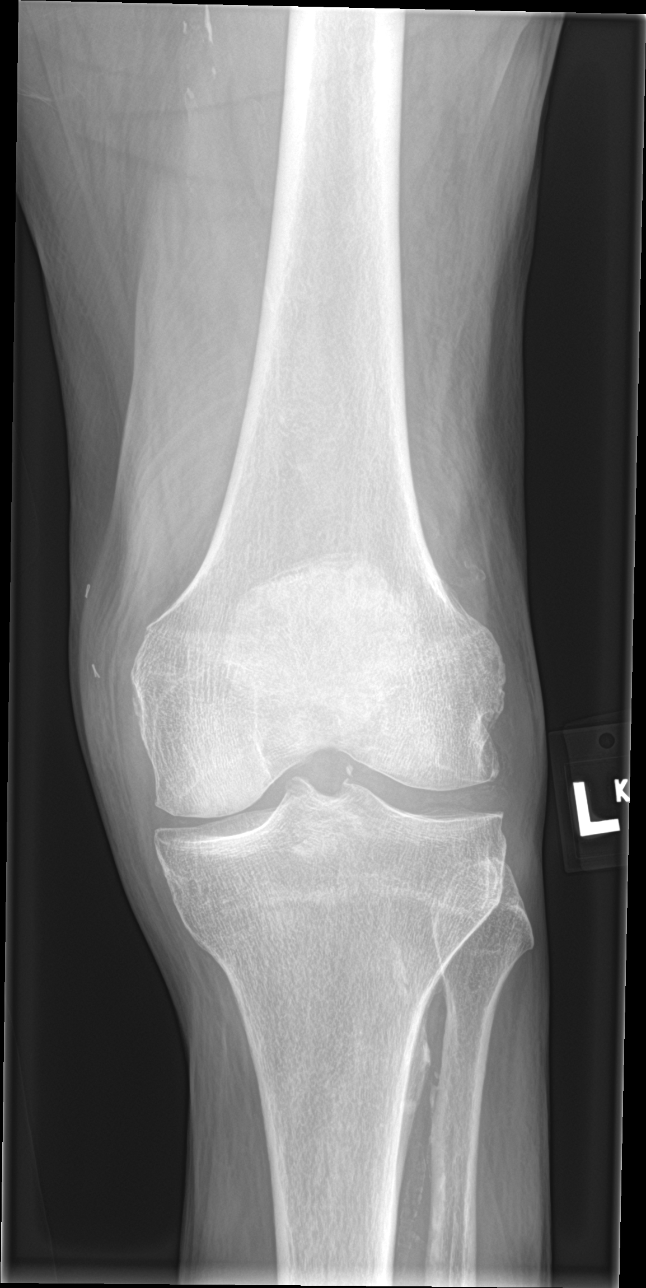

[knee obl (1 of 2)]
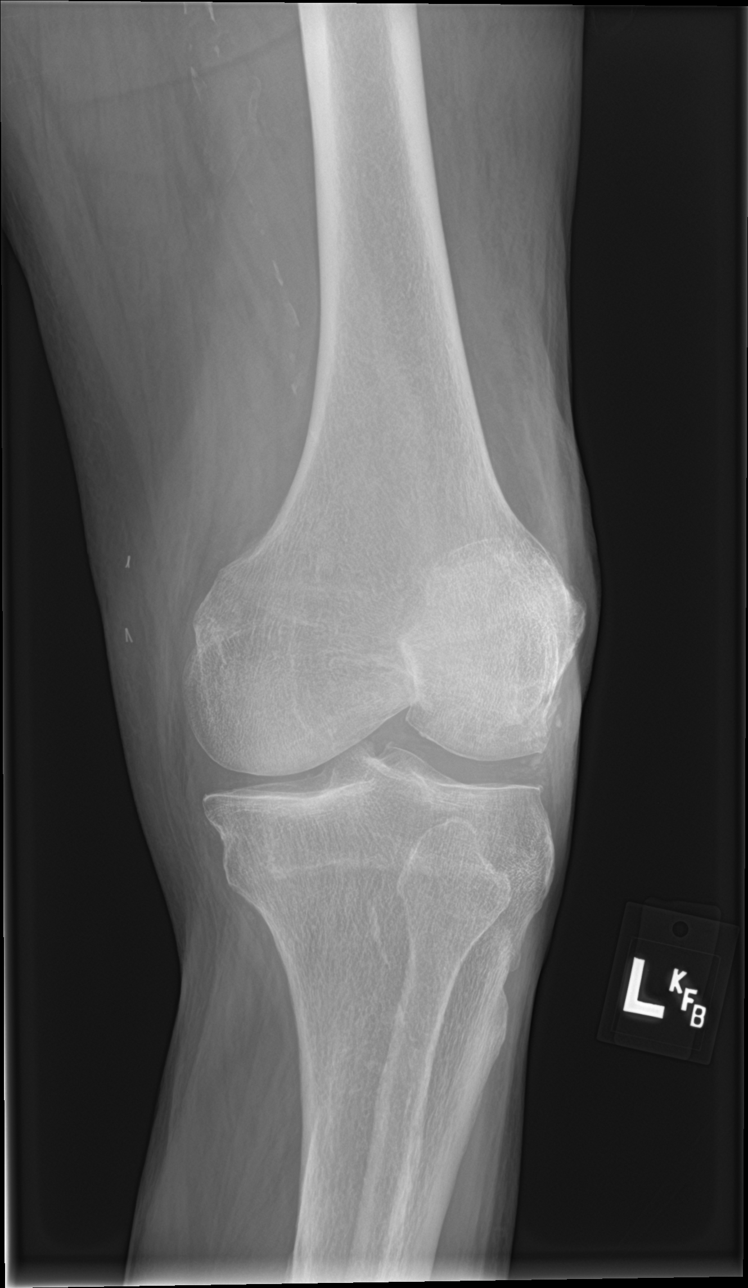

[knee obl (2 of 2)]
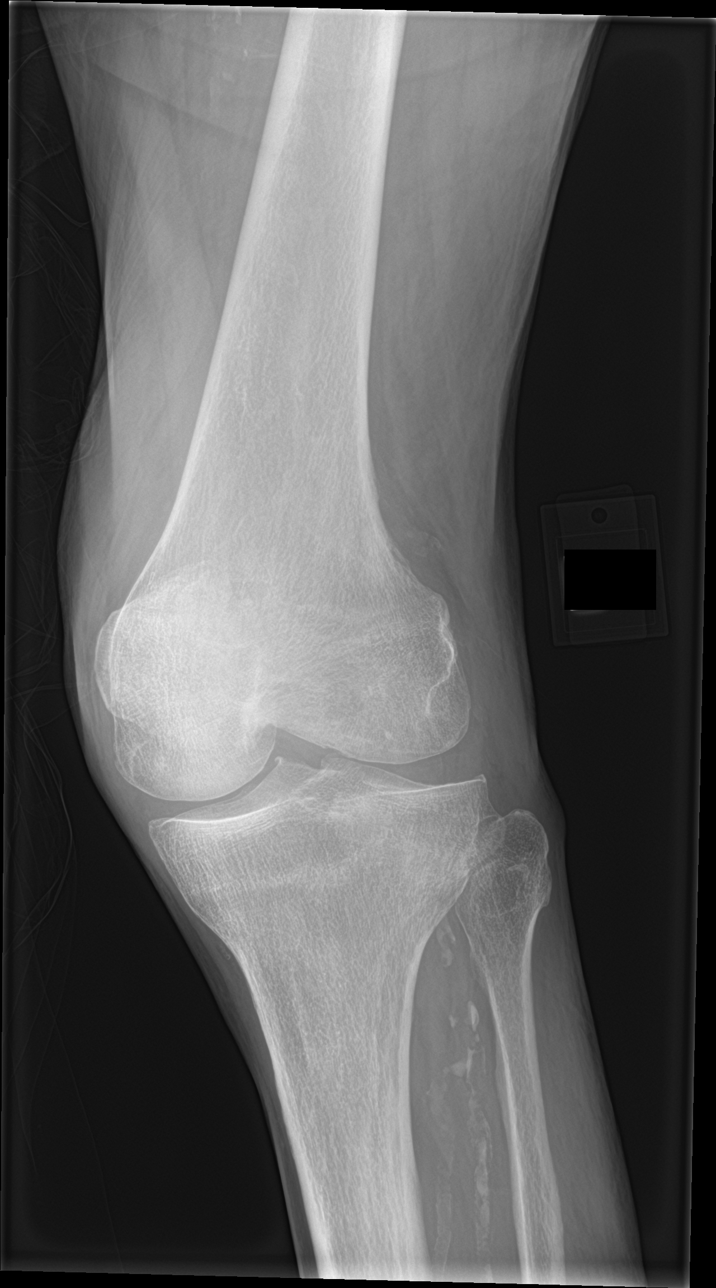

[knee lat]
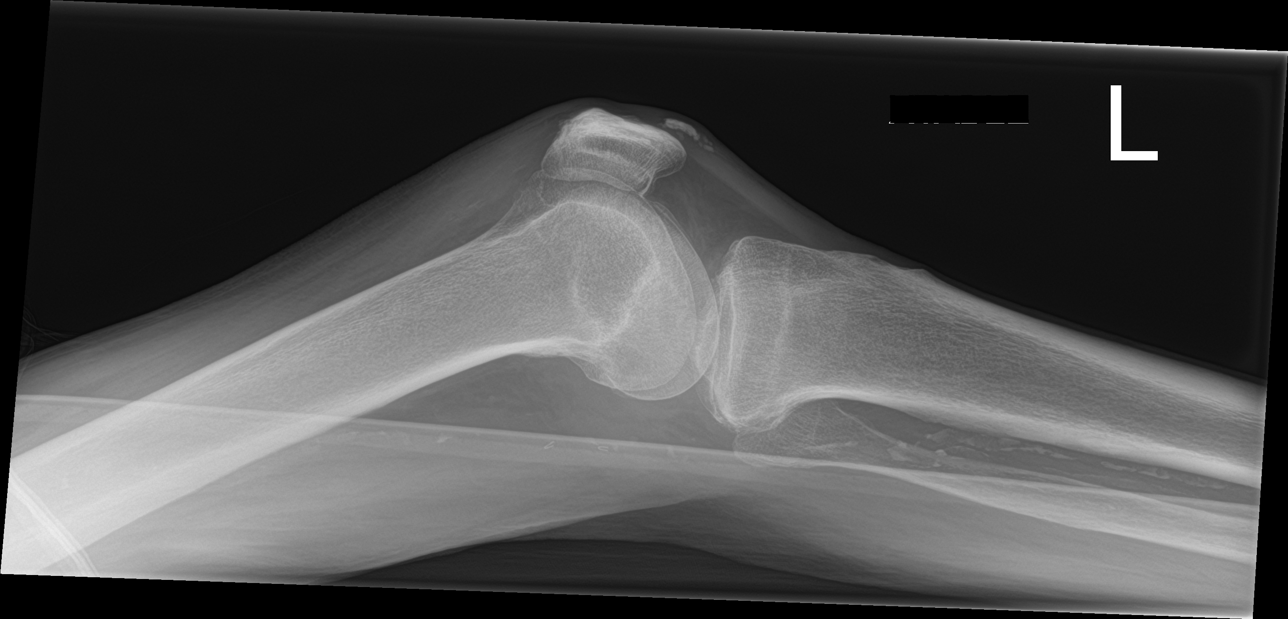

[knee sunrise]
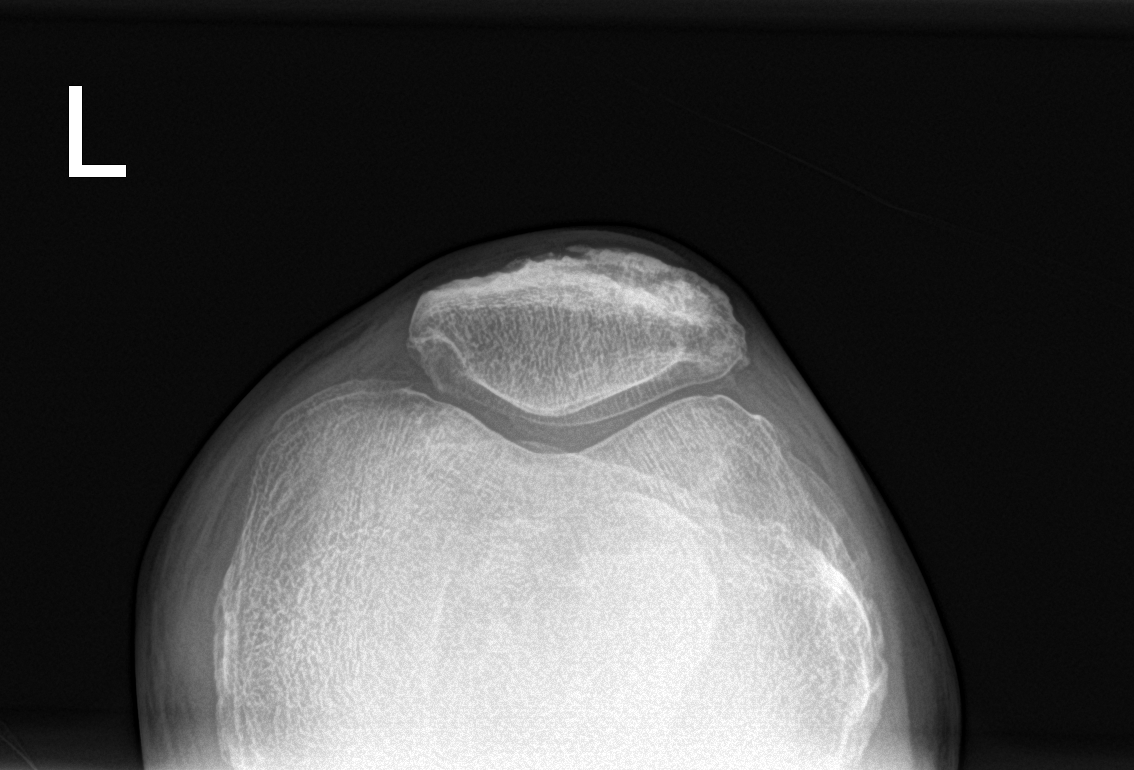

[5 of 5 positions shown; findings below may reference images not displayed]

FINDINGS: No evidence of acute or subacute fracture or dislocation. Mild
tricompartment joint space narrowing. Chondrocalcinosis involving
the LATERAL meniscus and to a lesser degree the MEDIAL meniscus. No
visible joint effusion. Dystrophic calcifications involving the
patellar tendon near its insertion on the patella. No evidence of a
tracking abnormality on the patellar sunrise view.

Femoropopliteal and tibioperoneal artery atherosclerosis.
IMPRESSION: 1. No acute or subacute osseous abnormality.
2. Mild tricompartment osteoarthritis.
3. CPPD with chondrocalcinosis involving the menisci.
4. Dystrophic calcifications involving the patellar tendon near its
insertion on the patella, possibly related to prior injury/tear.

## 2020-03-01 ENCOUNTER — Encounter: Payer: Self-pay | Admitting: General Practice

## 2020-05-24 DEATH — deceased
